# Patient Record
Sex: Female | Born: 1965
Health system: Southern US, Community
[De-identification: ages and names within clinical notes are randomized; demographics above are authoritative.]

## PROBLEM LIST (undated history)

## (undated) DIAGNOSIS — F419 Anxiety disorder, unspecified: Secondary | ICD-10-CM

## (undated) DIAGNOSIS — I10 Essential (primary) hypertension: Secondary | ICD-10-CM

## (undated) DIAGNOSIS — E785 Hyperlipidemia, unspecified: Secondary | ICD-10-CM

## (undated) DIAGNOSIS — F17200 Nicotine dependence, unspecified, uncomplicated: Secondary | ICD-10-CM

## (undated) DIAGNOSIS — S2239XA Fracture of one rib, unspecified side, initial encounter for closed fracture: Secondary | ICD-10-CM

## (undated) DIAGNOSIS — H6690 Otitis media, unspecified, unspecified ear: Secondary | ICD-10-CM

## (undated) DIAGNOSIS — R7611 Nonspecific reaction to tuberculin skin test without active tuberculosis: Secondary | ICD-10-CM

## (undated) DIAGNOSIS — Z86010 Personal history of colonic polyps: Secondary | ICD-10-CM

## (undated) DIAGNOSIS — N6320 Unspecified lump in the left breast, unspecified quadrant: Secondary | ICD-10-CM

## (undated) DIAGNOSIS — N390 Urinary tract infection, site not specified: Secondary | ICD-10-CM

## (undated) DIAGNOSIS — Z8709 Personal history of other diseases of the respiratory system: Secondary | ICD-10-CM

## (undated) HISTORY — DX: Fracture of one rib, unspecified side, initial encounter for closed fracture: S22.39XA

## (undated) HISTORY — DX: Anxiety disorder, unspecified: F41.9

## (undated) HISTORY — DX: Unspecified lump in the left breast, unspecified quadrant: N63.20

## (undated) HISTORY — DX: Nicotine dependence, unspecified, uncomplicated: F17.200

## (undated) HISTORY — DX: Hyperlipidemia, unspecified: E78.5

## (undated) HISTORY — DX: Personal history of colonic polyps: Z86.010

## (undated) HISTORY — DX: Nonspecific reaction to tuberculin skin test without active tuberculosis: R76.11

## (undated) HISTORY — DX: Urinary tract infection, site not specified: N39.0

## (undated) HISTORY — PX: WISDOM TOOTH EXTRACTION: SHX21

## (undated) HISTORY — DX: Essential (primary) hypertension: I10

## (undated) HISTORY — PX: OTHER SURGICAL HISTORY: SHX169

## (undated) HISTORY — PX: TUBAL LIGATION: SHX77

## (undated) HISTORY — DX: Personal history of other diseases of the respiratory system: Z87.09

---

## 1994-12-16 DIAGNOSIS — Z8709 Personal history of other diseases of the respiratory system: Secondary | ICD-10-CM

## 1994-12-16 HISTORY — DX: Personal history of other diseases of the respiratory system: Z87.09

## 2013-11-02 ENCOUNTER — Encounter: Payer: Self-pay | Admitting: Physical Therapy

## 2013-12-16 DIAGNOSIS — N6320 Unspecified lump in the left breast, unspecified quadrant: Secondary | ICD-10-CM

## 2013-12-16 HISTORY — DX: Unspecified lump in the left breast, unspecified quadrant: N63.20

## 2013-12-27 ENCOUNTER — Other Ambulatory Visit (HOSPITAL_COMMUNITY): Payer: Self-pay | Admitting: *Deleted

## 2013-12-27 DIAGNOSIS — N6320 Unspecified lump in the left breast, unspecified quadrant: Secondary | ICD-10-CM

## 2013-12-28 ENCOUNTER — Encounter (HOSPITAL_COMMUNITY): Payer: Self-pay

## 2013-12-28 ENCOUNTER — Encounter (INDEPENDENT_AMBULATORY_CARE_PROVIDER_SITE_OTHER): Payer: Self-pay

## 2013-12-28 ENCOUNTER — Ambulatory Visit (HOSPITAL_COMMUNITY)
Admission: RE | Admit: 2013-12-28 | Discharge: 2013-12-28 | Disposition: A | Discharge status: Home / Self Care | Payer: Self-pay | Source: Ambulatory Visit | Attending: Obstetrics and Gynecology | Admitting: Obstetrics and Gynecology

## 2013-12-28 VITALS — BP 120/78 | Temp 97.7°F | Ht 64.0 in | Wt 128.8 lb

## 2013-12-28 DIAGNOSIS — N6311 Unspecified lump in the right breast, upper outer quadrant: Secondary | ICD-10-CM | POA: Insufficient documentation

## 2013-12-28 DIAGNOSIS — Z01419 Encounter for gynecological examination (general) (routine) without abnormal findings: Secondary | ICD-10-CM

## 2013-12-28 DIAGNOSIS — N6320 Unspecified lump in the left breast, unspecified quadrant: Secondary | ICD-10-CM | POA: Insufficient documentation

## 2013-12-28 HISTORY — DX: Otitis media, unspecified, unspecified ear: H66.90

## 2013-12-28 NOTE — [facility] Notes (Signed)
Complaints of left breast lump x 1 month.  Pap Smear:  Pap smear completed today. Patients last Pap smear was 10 years ago and normal per patient. Per patient has no history of an abnormal Pap smear. No Pap smear results in EPIC.  Physical exam: Breasts Breasts symmetrical. No skin abnormalities bilateral breasts. No nipple retraction bilateral breasts. No nipple discharge bilateral breasts. No lymphadenopathy. Palpated three moveable lumps within the left breast at 2 o'clock 7 cm from the nipple, 6 o'clock under areola, and 7 o'clock 2 cm from the nipple. Palpated a moveable lump within the right breast at 10 o'clock 5 cm from the nipple. Complaints of some tenderness when palpated lumps within the left lower breast. Referred patient to the [facility] for diagnostic mammogram. Appointment scheduled for Tuesday January 04, 2014 at 0900.          Pelvic/Bimanual   Ext Genitalia No lesions, no swelling and no discharge observed on external genitalia.         Vagina Vagina pink and normal texture. No lesions and small amount a vaginal bleeding observed in vagina consistent with patient's menstrual cycle.          Cervix Cervix is present. Cervix pink and of normal texture. Small amount of blood observed at cervical os.     Uterus Uterus is present and palpable. Uterus in normal position and normal size.        Adnexae Bilateral ovaries present and palpable. No tenderness on palpation.          Rectovaginal No rectal exam completed today since patient had no rectal complaints. No skin abnormalities observed on exam.

## 2013-12-28 NOTE — Patient Instructions (Addendum)
Taught Kenyette Gundy how to perform BSE and gave educational materials to take home. Let her know [facility] will cover Pap smears every 3 years unless has a history of abnormal Pap smears. Referred patient to the [facility] for diagnostic mammogram. Appointment scheduled for Tuesday January 04, 2014 at 0900. Patient aware of appointment and will be there. Let patient know will follow up with her within the next couple weeks with results of Pap smear by phone. Smoking cessation discussed with patient. Carlota Philley verbalized understanding.  Yoon Barca, Arvil Chaco, RN 11:41 AM

## 2014-01-04 ENCOUNTER — Ambulatory Visit
Admission: RE | Admit: 2014-01-04 | Discharge: 2014-01-04 | Disposition: A | Discharge status: Home / Self Care | Payer: No Typology Code available for payment source | Source: Ambulatory Visit | Attending: Obstetrics and Gynecology | Admitting: Obstetrics and Gynecology

## 2014-01-04 ENCOUNTER — Other Ambulatory Visit (HOSPITAL_COMMUNITY): Payer: Self-pay | Admitting: Obstetrics and Gynecology

## 2014-01-04 DIAGNOSIS — N6320 Unspecified lump in the left breast, unspecified quadrant: Secondary | ICD-10-CM

## 2014-02-18 ENCOUNTER — Other Ambulatory Visit: Payer: Self-pay | Admitting: Oncology

## 2014-03-16 ENCOUNTER — Telehealth (HOSPITAL_COMMUNITY): Payer: Self-pay | Admitting: *Deleted

## 2014-03-16 NOTE — Telephone Encounter (Signed)
Telephoned patient at home # and discussed negative pap smear results. Next pap smear due in 3 years. Patient voiced understanding.

## 2014-03-29 ENCOUNTER — Other Ambulatory Visit: Payer: Self-pay

## 2014-05-03 ENCOUNTER — Other Ambulatory Visit: Payer: Self-pay | Admitting: Oncology

## 2014-06-23 ENCOUNTER — Other Ambulatory Visit: Payer: Self-pay | Admitting: *Deleted

## 2014-07-15 ENCOUNTER — Other Ambulatory Visit: Payer: Self-pay | Admitting: Obstetrics and Gynecology

## 2014-07-15 DIAGNOSIS — N63 Unspecified lump in unspecified breast: Secondary | ICD-10-CM

## 2014-07-21 ENCOUNTER — Encounter (INDEPENDENT_AMBULATORY_CARE_PROVIDER_SITE_OTHER): Payer: Self-pay

## 2014-07-21 ENCOUNTER — Ambulatory Visit
Admission: RE | Admit: 2014-07-21 | Discharge: 2014-07-21 | Disposition: A | Discharge status: Home / Self Care | Payer: No Typology Code available for payment source | Source: Ambulatory Visit | Attending: Obstetrics and Gynecology | Admitting: Obstetrics and Gynecology

## 2014-07-21 DIAGNOSIS — N63 Unspecified lump in unspecified breast: Secondary | ICD-10-CM

## 2014-08-29 ENCOUNTER — Other Ambulatory Visit: Payer: Self-pay | Admitting: Oncology

## 2014-09-01 ENCOUNTER — Other Ambulatory Visit: Payer: Self-pay | Admitting: Oncology

## 2014-10-17 ENCOUNTER — Encounter (HOSPITAL_COMMUNITY): Payer: Self-pay

## 2014-12-16 DIAGNOSIS — S2239XA Fracture of one rib, unspecified side, initial encounter for closed fracture: Secondary | ICD-10-CM

## 2014-12-16 HISTORY — DX: Fracture of one rib, unspecified side, initial encounter for closed fracture: S22.39XA

## 2014-12-17 ENCOUNTER — Emergency Department (INDEPENDENT_AMBULATORY_CARE_PROVIDER_SITE_OTHER)
Admission: EM | Admit: 2014-12-17 | Discharge: 2014-12-17 | Disposition: A | Discharge status: Home / Self Care | Payer: Self-pay | Source: Home / Self Care | Attending: Emergency Medicine | Admitting: Emergency Medicine

## 2014-12-17 ENCOUNTER — Encounter (HOSPITAL_COMMUNITY): Payer: Self-pay

## 2014-12-17 ENCOUNTER — Ambulatory Visit (HOSPITAL_COMMUNITY): Payer: Self-pay | Attending: Emergency Medicine

## 2014-12-17 DIAGNOSIS — R0781 Pleurodynia: Secondary | ICD-10-CM

## 2014-12-17 DIAGNOSIS — S2231XA Fracture of one rib, right side, initial encounter for closed fracture: Secondary | ICD-10-CM | POA: Insufficient documentation

## 2014-12-17 DIAGNOSIS — W19XXXA Unspecified fall, initial encounter: Secondary | ICD-10-CM | POA: Insufficient documentation

## 2014-12-17 LAB — POCT URINALYSIS DIP (DEVICE)
Bilirubin Urine: NEGATIVE
Glucose, UA: NEGATIVE mg/dL
KETONES UR: NEGATIVE mg/dL
Nitrite: POSITIVE — AB
PROTEIN: NEGATIVE mg/dL
SPECIFIC GRAVITY, URINE: 1.015 (ref 1.005–1.030)
UROBILINOGEN UA: 0.2 mg/dL (ref 0.0–1.0)
pH: 7 (ref 5.0–8.0)

## 2014-12-17 MED ORDER — OXYCODONE-ACETAMINOPHEN 5-325 MG PO TABS: 2.0000 | ORAL_TABLET | ORAL | Status: DC | PRN

## 2014-12-17 NOTE — ED Provider Notes (Signed)
CSN: [id number]     Arrival date & time 12/17/14  1557 History   First MD Initiated Contact with Patient 12/17/14 1637     Chief Complaint  Patient presents with  . Fall   (Consider location/radiation/quality/duration/timing/severity/associated sxs/prior Treatment) HPI  She is a 49 year old woman here for evaluation of right flank pain. She states it started yesterday morning. It is located in her lower ribs on the right side. It is worse with movement and deep breathing. At rest it isn't dull ache, but sharp pains with movement. She denies any dysuria, frequency, suprapubic pain. She did have a fall Thursday night.  Past Medical History  Diagnosis Date  . Ear infection    Past Surgical History  Procedure Laterality Date  . Inner ear surgery    . Tubal ligation     Family History  Problem Relation Age of Onset  . Breast cancer Mother   . Cancer Mother     uterine  . Hypertension Mother   . Stroke Mother   . Cancer Maternal Grandmother     uterine  . Stroke Maternal Grandmother    History  Substance Use Topics  . Smoking status: Current Every Day Smoker -- 1.00 packs/day for 20 years    Types: Cigarettes  . Smokeless tobacco: Never Used  . Alcohol Use: Yes     Comment: socially   OB History    Gravida Para Term Preterm AB TAB SAB Ectopic Multiple Living   4 3 3  1  1   3      Review of Systems  Respiratory: Negative for shortness of breath.   Genitourinary: Positive for flank pain. Negative for dysuria and frequency.    Allergies  Review of patient's allergies indicates no known allergies.  Home Medications   Prior to Admission medications   Medication Sig Start Date End Date Taking? Authorizing Provider  acetaminophen (TYLENOL) 500 MG tablet Take 500 mg by mouth every 6 (six) hours as needed.    Historical Provider, MD  oxyCODONE-acetaminophen (PERCOCET/ROXICET) 5-325 MG per tablet Take 2 tablets by mouth every 4 (four) hours as needed for severe pain. 12/17/14    Melony Overly, MD   BP 132/82 mmHg  Pulse 79  Temp(Src) 98.3 F (36.8 C) (Oral)  Resp 12  SpO2 98%  LMP 12/28/2013 Physical Exam  Constitutional: She is oriented to person, place, and time. She appears well-developed and well-nourished. She appears distressed (uncomfortable looking).  Cardiovascular: Normal rate.   Pulmonary/Chest: Effort normal.  Abdominal: Soft. Bowel sounds are normal. There is no tenderness.  Musculoskeletal:       Back:  Back: no vertebral tenderness.  Tender to light palpation over right CVA.  Neurological: She is alert and oriented to person, place, and time.    ED Course  Procedures (including critical care time) Labs Review Labs Reviewed  POCT URINALYSIS DIP (DEVICE) - Abnormal; Notable for the following:    Hgb urine dipstick TRACE (*)    Nitrite POSITIVE (*)    Leukocytes, UA TRACE (*)    All other components within normal limits  URINE CULTURE    Imaging Review Dg Ribs Unilateral W/chest Right  12/17/2014   CLINICAL DATA:  Status post fall; fell on right side, with posterior lower rib pain. Initial encounter.  EXAM: RIGHT RIBS AND CHEST - 3+ VIEW  COMPARISON:  None.  FINDINGS: There appears to be a minimally displaced fracture of the right lateral eleventh rib. No additional rib fractures are seen.  The lungs are well-aerated and clear. There is no evidence of focal opacification, pleural effusion or pneumothorax. A calcified granuloma is noted at the right midlung zone.  The cardiomediastinal silhouette is within normal limits. No acute osseous abnormalities are seen. An apparent calcified node is seen at the right axilla.  IMPRESSION: 1. Apparent minimally displaced fracture of the right lateral eleventh rib. 2. No acute cardiopulmonary process seen. 3. Prominent calcified node at the right axilla.   Electronically Signed   By: Garald Balding M.D.   On: 12/17/2014 18:05     MDM   1. Rib fracture, right, closed, initial encounter   2. Rib pain     We'll send urine for culture, but hold off on antibiotics as she is asymptomatic.  X-ray is positive for fracture. Discussed that this will heal on its own. Percocet 5-325 milligram tablet, #30 provided to use as needed for pain. Discussed importance of taking deep breaths to prevent pneumonia. Follow-up as needed.    Melony Overly, MD 12/17/14 [phone]

## 2014-12-17 NOTE — Discharge Instructions (Signed)
You have a broken rib. Use the percocet every 4 hours as needed for pain. Make sure you take deep breaths several times a day. After the next week, you should be okay with tylenol and ibuprofen. Follow up as needed.
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
## 2014-12-20 LAB — URINE CULTURE: Colony Count: >=100000

## 2014-12-20 NOTE — ED Notes (Signed)
Urine culture: >100,000 colonies E. Coli.  Message sent to Dr. Bridgett Larsson. Roselyn Meier 12/20/2014

## 2014-12-22 ENCOUNTER — Telehealth (HOSPITAL_COMMUNITY): Payer: Self-pay | Admitting: *Deleted

## 2014-12-22 MED ORDER — CEPHALEXIN 500 MG PO CAPS: 500.0000 mg | ORAL_CAPSULE | Freq: Four times a day (QID) | ORAL | Status: DC

## 2014-12-22 NOTE — ED Notes (Signed)
Dr. Bridgett Larsson e-prescribed Keflex to pt.'s pharmacy.  I called pt. Pt. verified x 2 and given results. Pt. told she needs Keflex for her UTI and where to pick up her Rx.  Pt. Voiced understanding. Roselyn Meier 12/22/2014

## 2015-01-02 ENCOUNTER — Other Ambulatory Visit: Payer: Self-pay | Admitting: Oncology

## 2015-06-21 ENCOUNTER — Encounter: Payer: Self-pay | Admitting: Family Medicine

## 2015-06-21 ENCOUNTER — Ambulatory Visit (INDEPENDENT_AMBULATORY_CARE_PROVIDER_SITE_OTHER): Payer: 59 | Admitting: Family Medicine

## 2015-06-21 ENCOUNTER — Ambulatory Visit (HOSPITAL_BASED_OUTPATIENT_CLINIC_OR_DEPARTMENT_OTHER)
Admission: RE | Admit: 2015-06-21 | Discharge: 2015-06-21 | Disposition: A | Discharge status: Home / Self Care | Payer: 59 | Source: Ambulatory Visit | Attending: Family Medicine | Admitting: Family Medicine

## 2015-06-21 VITALS — BP 122/83 | HR 92 | Temp 98.3°F | Resp 16 | Ht 63.25 in | Wt 133.0 lb

## 2015-06-21 DIAGNOSIS — M7542 Impingement syndrome of left shoulder: Secondary | ICD-10-CM

## 2015-06-21 DIAGNOSIS — M7582 Other shoulder lesions, left shoulder: Secondary | ICD-10-CM

## 2015-06-21 DIAGNOSIS — M19019 Primary osteoarthritis, unspecified shoulder: Secondary | ICD-10-CM

## 2015-06-21 DIAGNOSIS — M25512 Pain in left shoulder: Secondary | ICD-10-CM | POA: Diagnosis not present

## 2015-06-21 DIAGNOSIS — M75102 Unspecified rotator cuff tear or rupture of left shoulder, not specified as traumatic: Secondary | ICD-10-CM | POA: Diagnosis not present

## 2015-06-21 DIAGNOSIS — M259 Joint disorder, unspecified: Secondary | ICD-10-CM

## 2015-06-21 MED ORDER — LIDOCAINE HCL 1 % IJ SOLN
0.5000 mL | Freq: Once | INTRAMUSCULAR | Status: AC
  Administered 2015-06-21: 0.5 mL

## 2015-06-21 MED ORDER — METHYLPREDNISOLONE ACETATE 40 MG/ML IJ SUSP
40.0000 mg | Freq: Once | INTRAMUSCULAR | Status: AC
  Administered 2015-06-21: 40 mg via INTRA_ARTICULAR

## 2015-06-21 MED ORDER — METHYLPREDNISOLONE ACETATE 40 MG/ML IJ SUSP
20.0000 mg | Freq: Once | INTRAMUSCULAR | Status: AC
  Administered 2015-06-21: 20 mg via INTRA_ARTICULAR

## 2015-06-21 MED ORDER — LIDOCAINE HCL 1 % IJ SOLN
2.0000 mL | Freq: Once | INTRAMUSCULAR | Status: AC
  Administered 2015-06-21: 2 mL

## 2015-06-21 NOTE — [facility] Notes (Signed)
Office Note 06/21/2015  CC:  Chief Complaint  Patient presents with  . Establish Care  . Shoulder Pain    left shoulder x 6 weeks   HPI:  Christy [name] is a 49 y.o. White female who is here to establish care and discuss left shoulder pain. Patient's most recent primary MD: doesn't remember--says it has been > 82yrs since having one.  Went to [employer] [facility] mostly. Gets GYN care at the [facility] center at [facility]. Old records in EPIC/HL EMR were reviewed prior to or during today's visit.  About 6-7 wks ago her left shoulder started hurting, anterolateral aspect.  Recalls no injury. She had been moving boxes/furnature prior.  Pain getting worse/more constant, extends down upper arm to elbow level. Worse with aBduction and IR maneuvers.  Some trapezius area pain but no neck pain.  NO tingling or numbness in the arm.  No L arm weakness except that due to pain. Never had this before.  Past Medical History  Diagnosis Date  . Ear infection     Recurrent; lots of scar tissue, surgeries on TM, eventually TM removed.  Marland Kitchen UTI (urinary tract infection)   . Positive skin test for tuberculosis age 67 approx    father had TB and died when she was 67; pt doesn't recall having CXR or getting TB tx.  . Rib fracture 12/2014    s/p fall  . History of pleurisy 1996  . Left breast mass 2015    Appears benign, no bx done, getting serial mammograms via [facility].  . Tobacco dependence     Past Surgical History  Procedure Laterality Date  . Tympanectomy    . Tubal ligation      Family History  Problem Relation Age of Onset  . Breast cancer Mother   . Cancer Mother     uterine  . Hypertension Mother   . Stroke Mother   . Heart disease Mother   . COPD Mother   . Cancer Maternal Grandmother     uterine  . Stroke Maternal Grandmother   . Heart disease Maternal Grandmother   . Hypertension Maternal Grandmother   . Emphysema Father   . Tuberculosis Father     History   Social History  .  Marital Status: Married    Spouse Name: N/A  . Number of Children: N/A  . Years of Education: N/A   Occupational History  . Not on file.   Social History Main Topics  . Smoking status: Current Every Day Smoker -- 1.00 packs/day for 20 years    Types: Cigarettes  . Smokeless tobacco: Never Used  . Alcohol Use: Yes     Comment: socially  . Drug Use: No  . Sexual Activity: Yes    Birth Control/ Protection: Surgical   Other Topics Concern  . Not on file   Social History Narrative   Married, 3 children.   Housewife.   Orig from [place], relocated to [facility] approx 2007.   +Smoker.   MEDS: none  No Known Allergies  ROS Review of Systems  Constitutional: Negative for fever and fatigue.  HENT: Negative for congestion and sore throat.   Eyes: Negative for visual disturbance.  Respiratory: Negative for cough.   Cardiovascular: Negative for chest pain.  Gastrointestinal: Negative for nausea and abdominal pain.  Genitourinary: Negative for dysuria.  Musculoskeletal: Negative for back pain and joint swelling.  Skin: Negative for rash.  Neurological: Negative for weakness and headaches.  Hematological: Negative for adenopathy.  PE; Blood pressure 122/83, pulse 92, temperature 98.3 F (36.8 C), temperature source Oral, resp. rate 16, height 5' 3.25" (1.607 m), weight 133 lb (60.328 kg), last menstrual period 12/28/2013, SpO2 97 %. Gen: Alert, well appearing.  Patient is oriented to person, place, time, and situation. CV: RRR, no m/r/g.   LUNGS: CTA bilat, nonlabored resps, good aeration in all lung fields. Neck nontender, ROM intact. L AC joint TTP, mild TTP around acromion hook as well as over top of L trapezius mm. Tender over biceps head (long).  +Impingement signs on L shoulder. Neg drop sign.  She can only aBduct her left shoulder to 70 deg due to pain.  Resisted IR and ER don't elicit pain, but she has pain with trying to reach around and touch her ipsilateral scapula with  L hand.  Prox and distal strength intact, sensation normal.  Pertinent labs:  none  ASSESSMENT AND PLAN:   New pt; no old records to obtain.  Left shoulder pain, acute but prolonged. Not responding to NSAIDs/tylenol. Plan: x-ray shoulder. Steroid injection in L AC joint and L subacromial space. Referral to [facility] PT.   Procedure: Therapeutic left shoulder injection: AC joint AND subacromial space.  The patient's clinical condition is marked by substantial pain and/or significant functional disability.  Other conservative therapy has not provided relief, is contraindicated, or not appropriate.  There is a reasonable likelihood that injection will significantly improve the patient's pain and/or functional disability. Cleaned skin with alcohol swab, used posterolateral approach, Injected 1 ml of 40mg /ml depo medrol + 2 ml 1% plain lidocaine into subacromial space without resistance.  No immediate complications.  Patient tolerated procedure well.  I then injected 1/2 ml of 40mg /ml depo medrol + 1/2 ml of 1% plain lidocaine into AC joint using superior approach with 5/8 inch, 25 gauge needle.  Post-injection care discussed, including 20 min of icing 1-2 times in the next 4-8 hours, frequent non weight-bearing ROM exercises over the next few days, and general pain medication management.  An After Visit Summary was printed and given to the patient.  Return in about 3 weeks (around 07/12/2015) for f/u shoulder pain.

## 2015-06-21 NOTE — [facility] Notes (Signed)
Pre visit review using our clinic review tool, if applicable. No additional management support is needed unless otherwise documented below in the visit note. 

## 2015-06-21 NOTE — Patient Instructions (Signed)
Ice your shoulder tonight for 20-30 min TWICE.

## 2015-06-22 ENCOUNTER — Telehealth: Payer: Self-pay | Admitting: *Deleted

## 2015-06-22 ENCOUNTER — Other Ambulatory Visit: Payer: Self-pay | Admitting: Family Medicine

## 2015-06-22 MED ORDER — OXYCODONE-ACETAMINOPHEN 5-325 MG PO TABS: 1.0000 | ORAL_TABLET | Freq: Four times a day (QID) | ORAL | Status: DC | PRN

## 2015-06-22 NOTE — Telephone Encounter (Signed)
OK, percocet rx printed. Reassure pt that worse pain today can be normal. If still worse tomorrow despite use of this pain med then tell her to call and let us know.-thx

## 2015-06-22 NOTE — Telephone Encounter (Signed)
Pt LMOM stating that she is in more pain today then she was in yesterday and she has less mobility. She wants to know if this is normal. Also wanted to know if apt for PT has been set up yet. Spoke to [place] and she is going to check into this and let me know.

## 2015-06-22 NOTE — Telephone Encounter (Signed)
Pt advised and voiced understanding.   

## 2015-06-29 ENCOUNTER — Other Ambulatory Visit: Payer: Self-pay | Admitting: Oncology

## 2015-07-12 ENCOUNTER — Ambulatory Visit (INDEPENDENT_AMBULATORY_CARE_PROVIDER_SITE_OTHER): Payer: 59 | Admitting: Family Medicine

## 2015-07-12 ENCOUNTER — Encounter: Payer: Self-pay | Admitting: Family Medicine

## 2015-07-12 VITALS — BP 108/76 | HR 88 | Temp 98.0°F | Resp 16 | Ht 63.25 in | Wt 132.0 lb

## 2015-07-12 DIAGNOSIS — N390 Urinary tract infection, site not specified: Secondary | ICD-10-CM

## 2015-07-12 DIAGNOSIS — R319 Hematuria, unspecified: Secondary | ICD-10-CM | POA: Diagnosis not present

## 2015-07-12 DIAGNOSIS — M25512 Pain in left shoulder: Secondary | ICD-10-CM | POA: Diagnosis not present

## 2015-07-12 DIAGNOSIS — M7582 Other shoulder lesions, left shoulder: Secondary | ICD-10-CM

## 2015-07-12 LAB — POCT URINALYSIS DIPSTICK
GLUCOSE UA: NEGATIVE
NITRITE UA: NEGATIVE
Protein, UA: NEGATIVE
Spec Grav, UA: >=1.03
Urobilinogen, UA: 0.2
pH, UA: 5.5

## 2015-07-12 MED ORDER — SULFAMETHOXAZOLE-TRIMETHOPRIM 800-160 MG PO TABS: 1.0000 | ORAL_TABLET | Freq: Two times a day (BID) | ORAL | Status: DC

## 2015-07-12 MED ORDER — OXYCODONE-ACETAMINOPHEN 5-325 MG PO TABS: 1.0000 | ORAL_TABLET | Freq: Four times a day (QID) | ORAL | Status: DC | PRN

## 2015-07-12 NOTE — [facility] Notes (Signed)
OFFICE VISIT  07/13/2015   CC:  Chief Complaint  Patient presents with  . Follow-up    3 week f/u left shoulder pain  . Urinary Tract Infection    x 2 days   HPI:    Patient is a 49 y.o. Caucasian female who presents for 3 wk f/u L shoulder pain.  I did steroid injection into L AC joint and L subacromial space at last visit.  I referred her to [facility] PT.  A few days after the injection she called and I sent in some pain meds.   Says pain more steady and intense that when she first came, hurts to ABduct and IR/ER. Home PT not helping any.  No arm tingling or numbness.  No neck pain.  She complains today of urinary symptoms: slight burning with urination, musty odor, suprapubic pressure noted. No nausea and no flank pain or back pain.  Color is darker yellow.  She is trying to hydrate well.  Past Medical History  Diagnosis Date  . Ear infection     Recurrent; lots of scar tissue, surgeries on TM, eventually TM removed.  Marland Kitchen UTI (urinary tract infection)   . Positive skin test for tuberculosis age 45 approx    father had TB and died when she was 63; pt doesn't recall having CXR or getting TB tx.  . Rib fracture 12/2014    s/p fall  . History of pleurisy 1996  . Left breast mass 2015    Appears benign, no bx done, getting serial mammograms via [facility].  . Tobacco dependence     Past Surgical History  Procedure Laterality Date  . Tympanectomy    . Tubal ligation      Outpatient Prescriptions Prior to Visit  Medication Sig Dispense Refill  . acetaminophen (TYLENOL) 500 MG tablet Take 500 mg by mouth every 6 (six) hours as needed.    Marland Kitchen oxyCODONE-acetaminophen (PERCOCET/ROXICET) 5-325 MG per tablet Take 1-2 tablets by mouth every 6 (six) hours as needed for severe pain. (Patient not taking: Reported on 07/12/2015) 30 tablet 0   No facility-administered medications prior to visit.    No Known Allergies  ROS As per HPI  PE: Blood pressure 108/76, pulse 88,  temperature 98 F (36.7 C), temperature source Oral, resp. rate 16, height 5' 3.25" (1.607 m), weight 132 lb (59.875 kg), last menstrual period 12/28/2013, SpO2 95 %. Gen: Alert, well appearing.  Patient is oriented to person, place, time, and situation. L shoulder aBduction limited to 40 deg due to pain, ER and IR painful. TTP around acromion and directly over lateral deltoid mm. + O'brien's test.  No TTP over [employer] joint or clavicle. No CVA TTP.  LABS:  CC UA today showed small blood and leukocytes  IMPRESSION AND PLAN:  1) L shoulder pain, suspect rotator cuff tendonitis vs tear, also possible labral injury. Not improving with approx 3 wks of PT, no improvement s/p steroid injection 3 wks ago. Will arrange for L shoulder MRI w/out contrast.  May ultimately need ortho surg consult. I did renew her percocet rx ofr #30 more today.  2) UTI: send urine for c/s. Start bactrim DS 1 tab bid x 3d.  An After Visit Summary was printed and given to the patient.  FOLLOW UP: Return for f/u to be determined based on results of MRI shouder.

## 2015-07-12 NOTE — [facility] Notes (Signed)
Pre visit review using our clinic review tool, if applicable. No additional management support is needed unless otherwise documented below in the visit note. 

## 2015-07-15 ENCOUNTER — Ambulatory Visit
Admission: RE | Admit: 2015-07-15 | Discharge: 2015-07-15 | Disposition: A | Discharge status: Home / Self Care | Payer: 59 | Source: Ambulatory Visit | Attending: Family Medicine | Admitting: Family Medicine

## 2015-07-15 DIAGNOSIS — M25512 Pain in left shoulder: Secondary | ICD-10-CM

## 2015-07-15 DIAGNOSIS — M7582 Other shoulder lesions, left shoulder: Secondary | ICD-10-CM

## 2015-07-15 LAB — URINE CULTURE

## 2015-07-18 ENCOUNTER — Other Ambulatory Visit: Payer: Self-pay | Admitting: *Deleted

## 2015-07-18 DIAGNOSIS — M7552 Bursitis of left shoulder: Secondary | ICD-10-CM

## 2015-07-18 DIAGNOSIS — M7582 Other shoulder lesions, left shoulder: Secondary | ICD-10-CM

## 2015-07-18 DIAGNOSIS — M7502 Adhesive capsulitis of left shoulder: Secondary | ICD-10-CM

## 2015-12-22 ENCOUNTER — Ambulatory Visit (INDEPENDENT_AMBULATORY_CARE_PROVIDER_SITE_OTHER): Payer: Commercial Managed Care - HMO | Admitting: Family Medicine

## 2015-12-22 ENCOUNTER — Encounter: Payer: Self-pay | Admitting: Family Medicine

## 2015-12-22 VITALS — BP 121/82 | HR 69 | Temp 97.8°F | Resp 16 | Ht 63.25 in | Wt 138.0 lb

## 2015-12-22 DIAGNOSIS — F411 Generalized anxiety disorder: Secondary | ICD-10-CM

## 2015-12-22 DIAGNOSIS — Z780 Asymptomatic menopausal state: Secondary | ICD-10-CM

## 2015-12-22 DIAGNOSIS — N3000 Acute cystitis without hematuria: Secondary | ICD-10-CM

## 2015-12-22 DIAGNOSIS — R300 Dysuria: Secondary | ICD-10-CM

## 2015-12-22 DIAGNOSIS — N951 Menopausal and female climacteric states: Secondary | ICD-10-CM

## 2015-12-22 LAB — POC URINALSYSI DIPSTICK (AUTOMATED)
Bilirubin, UA: NEGATIVE
Glucose, UA: NEGATIVE
KETONES UA: NEGATIVE
Nitrite, UA: POSITIVE
PH UA: 6
Protein, UA: NEGATIVE
Spec Grav, UA: 1.02
Urobilinogen, UA: 0.2

## 2015-12-22 MED ORDER — SULFAMETHOXAZOLE-TRIMETHOPRIM 800-160 MG PO TABS
1.0000 | ORAL_TABLET | Freq: Two times a day (BID) | ORAL | Status: DC
Start: 2015-12-22 — End: 2016-01-19

## 2015-12-22 MED ORDER — FLUOXETINE HCL (PMDD) 10 MG PO TABS: ORAL_TABLET | ORAL | Status: DC

## 2015-12-22 NOTE — [facility] Notes (Signed)
OFFICE VISIT  12/22/2015   CC:  Chief Complaint  Patient presents with  . Urinary Tract Infection    x 4 days  . Menopause    Mood Swings   HPI:    Patient is a 50 y.o. Caucasian female who presents for dysuria every day the last 5d, mostly mornings and she relates this to having intercourse with husband the night prior.  No frequency or urgency or incomplete emptying.  "Going a lot" is not new and she drinks lots of fluids.  No nausea.  No upper back pain or flank pain or abd pain but has L low back pain lately.  No fever.  Took azo but none in the last 2 days. Last UTI 06/2015 had similar sx's and grew e coli--sensitive to everything except ampicillin.  She got better with bactrim.  Says she has menopausal mood swings.  Says husband complains that she will snap at him in anger about something, and she finds herself getting more easily frustrated with things lately.  More anxiety lately w/out any clear triggers, "sense of impending doom" yesterday.  She has not had a menstrual period in over 2 yrs, has hot flashes only occasionally now but these were bad at first.  Her sleep is poor but she is tired.  Denies depressed mood. Was on xanax in the past after her husband died but only short period.  No other meds. She is interested in a med to help her "chill out" a little bit.    Past Medical History  Diagnosis Date  . Ear infection     Recurrent; lots of scar tissue, surgeries on TM, eventually TM removed.  Marland Kitchen UTI (urinary tract infection)   . Positive skin test for tuberculosis age 64 approx    father had TB and died when she was 75; pt doesn't recall having CXR or getting TB tx.  . Rib fracture 12/2014    s/p fall  . History of pleurisy 1996  . Left breast mass 2015    Appears benign, no bx done, getting serial mammograms via [facility].  . Tobacco dependence     Past Surgical History  Procedure Laterality Date  . Tympanectomy    . Tubal ligation     MEDS: tylenol  prn  No Known Allergies  ROS As per HPI  PE: Blood pressure 121/82, pulse 69, temperature 97.8 F (36.6 C), temperature source Oral, resp. rate 16, height 5' 3.25" (1.607 m), weight 138 lb (62.596 kg), last menstrual period 12/28/2013, SpO2 92 %. Gen: Alert, well appearing.  Patient is oriented to person, place, time, and situation. CV: RRR, no m/r/g.   LUNGS: CTA bilat, nonlabored resps, good aeration in all lung fields. BACK: minimal TTP in lower thoracic paraspinous soft tissues, but no CVA tenderness.  LABS:  CC UA today showed small blood, nitrite pos, small LEU, otherwise normal  IMPRESSION AND PLAN:  1) UTI; send urine for c/s. ? UA affected by AZO? Started bactrim DS 1 bid x 5d, may stop abx if all sx's resolved after 3d.  2) Postmenopausal syndrome/anxiety/irritability: decided to try small dose of fluoxetine: 10mg  qd started. Therapeutic expectations and side effect profile of medication discussed today.  Patient's questions answered.  An After Visit Summary was printed and given to the patient.  FOLLOW UP: Return in about 4 weeks (around 01/19/2016) for f/u anxiety/med.

## 2015-12-22 NOTE — [facility] Notes (Signed)
Pre visit review using our clinic review tool, if applicable. No additional management support is needed unless otherwise documented below in the visit note. 

## 2015-12-25 LAB — URINE CULTURE: Colony Count: >=100000

## 2016-01-19 ENCOUNTER — Encounter: Payer: Self-pay | Admitting: Family Medicine

## 2016-01-19 ENCOUNTER — Ambulatory Visit (INDEPENDENT_AMBULATORY_CARE_PROVIDER_SITE_OTHER): Payer: Commercial Managed Care - HMO | Admitting: Family Medicine

## 2016-01-19 ENCOUNTER — Encounter: Payer: Self-pay | Admitting: Gastroenterology

## 2016-01-19 VITALS — BP 115/84 | HR 83 | Temp 97.9°F | Resp 16 | Ht 63.25 in | Wt 140.0 lb

## 2016-01-19 DIAGNOSIS — N951 Menopausal and female climacteric states: Secondary | ICD-10-CM | POA: Diagnosis not present

## 2016-01-19 DIAGNOSIS — Z23 Encounter for immunization: Secondary | ICD-10-CM

## 2016-01-19 DIAGNOSIS — F410 Panic disorder [episodic paroxysmal anxiety] without agoraphobia: Secondary | ICD-10-CM

## 2016-01-19 DIAGNOSIS — F411 Generalized anxiety disorder: Secondary | ICD-10-CM | POA: Diagnosis not present

## 2016-01-19 DIAGNOSIS — Z1211 Encounter for screening for malignant neoplasm of colon: Secondary | ICD-10-CM | POA: Diagnosis not present

## 2016-01-19 MED ORDER — FLUOXETINE HCL 20 MG PO TABS: 20.0000 mg | ORAL_TABLET | Freq: Every day | ORAL | Status: DC

## 2016-01-19 NOTE — Addendum Note (Signed)
Addended by: Onalee Hua on: 01/19/2016 11:38 AM   Modules accepted: Orders

## 2016-01-19 NOTE — [facility] Notes (Signed)
Pre visit review using our clinic review tool, if applicable. No additional management support is needed unless otherwise documented below in the visit note. 
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
## 2016-03-01 ENCOUNTER — Encounter: Payer: Self-pay | Admitting: Family Medicine

## 2016-03-01 ENCOUNTER — Ambulatory Visit (INDEPENDENT_AMBULATORY_CARE_PROVIDER_SITE_OTHER): Payer: Commercial Managed Care - HMO | Admitting: Family Medicine

## 2016-03-01 VITALS — BP 127/77 | HR 79 | Temp 97.5°F | Resp 16 | Ht 63.25 in | Wt 140.0 lb

## 2016-03-01 DIAGNOSIS — F411 Generalized anxiety disorder: Secondary | ICD-10-CM

## 2016-03-01 DIAGNOSIS — H6091 Unspecified otitis externa, right ear: Secondary | ICD-10-CM

## 2016-03-01 MED ORDER — CLOTRIMAZOLE 1 % EX SOLN: CUTANEOUS | Status: DC

## 2016-03-01 MED ORDER — FLUOXETINE HCL 20 MG PO TABS: 20.0000 mg | ORAL_TABLET | Freq: Every day | ORAL | Status: DC

## 2016-03-01 NOTE — [facility] Notes (Signed)
Pre visit review using our clinic review tool, if applicable. No additional management support is needed unless otherwise documented below in the visit note. 

## 2016-03-01 NOTE — Progress Notes (Signed)
OFFICE VISIT  03/01/2016   CC:  Chief Complaint  Patient presents with  . Follow-up    Anxiety  . Ear Drainage    right x 1 week    HPI:    Patient is a 50 y.o. Caucasian female who presents for 6 wk f/u GAD/panic. Increased fluoxetine to 20mg  qd last visit. Doing much better, just a bit of hand tremor noted as possible side effect. Mood improved, anxiety and irritability is down.  Sleep initiation still a chronic problem---nothing new.   Appetite ok.    Right ear drainage for about a week, starting to look brown, no pain.  This is in the ear with hx of surgery on TM.  No changes in hearing lately.  Past Medical History  Diagnosis Date  . Ear infection     Recurrent; lots of scar tissue, surgeries on TM, eventually TM removed.  Marland Kitchen UTI (urinary tract infection)   . Positive skin test for tuberculosis age 50 approx    father had TB and died when she was 30; pt doesn't recall having CXR or getting TB tx.  . Rib fracture 12/2014    s/p fall  . History of pleurisy 1996  . Left breast mass 2015    Appears benign, no bx done, getting serial mammograms via [facility].  . Tobacco dependence     Past Surgical History  Procedure Laterality Date  . Tympanectomy    . Tubal ligation      Outpatient Prescriptions Prior to Visit  Medication Sig Dispense Refill  . acetaminophen (TYLENOL) 500 MG tablet Take 500 mg by mouth every 6 (six) hours as needed.    Marland Kitchen FLUoxetine (PROZAC) 20 MG tablet Take 1 tablet (20 mg total) by mouth daily. 30 tablet 1   No facility-administered medications prior to visit.    No Known Allergies  ROS As per HPI  PE: Blood pressure 127/77, pulse 79, temperature 97.5 F (36.4 C), temperature source Oral, resp. rate 16, height 5' 3.25" (1.607 m), weight 140 lb (63.504 kg), last menstrual period 12/28/2013, SpO2 95 %. Wt Readings from Last 2 Encounters:  03/01/16 140 lb (63.504 kg)  01/19/16 140 lb (63.504 kg)    Gen: alert, oriented x 4,  affect pleasant.  Lucid thinking and conversation noted. HEENT: PERRLA, EOMI.   Right ear canal with erythema and greenish film on the wall of canal distally.  No swelling, no active drainage, no tenderness. Neck: no LAD, mass, or thyromegaly. CV: RRR, no m/r/g LUNGS: CTA bilat, nonlabored. NEURO: no tremor or tics noted on observation.  Coordination intact. CN 2-12 grossly intact bilaterally, strength 5/5 in all extremeties.  No ataxia.   LABS:  None today  IMPRESSION AND PLAN:  1) GAD: improved on 20mg  fluoxetine.  Continue this, rf sent in today.  2) R External otitis, suspect fungal type. Clotrimazole external solution 1%, 3 drops in right ear bid x 14d.  Repeat 14d treatment if still draining in 2 wks.  Return before that time if worsening.  An After Visit Summary was printed and given to the patient.  FOLLOW UP: Return in about 6 months (around 09/01/2016) for annual CPE (fasting).

## 2016-03-04 ENCOUNTER — Ambulatory Visit (AMBULATORY_SURGERY_CENTER): Payer: Self-pay | Admitting: *Deleted

## 2016-03-04 VITALS — Ht 64.0 in | Wt 140.0 lb

## 2016-03-04 DIAGNOSIS — Z1211 Encounter for screening for malignant neoplasm of colon: Secondary | ICD-10-CM

## 2016-03-04 MED ORDER — NA SULFATE-K SULFATE-MG SULF 17.5-3.13-1.6 GM/177ML PO SOLN: 1.0000 | Freq: Once | ORAL | Status: DC

## 2016-03-04 NOTE — Progress Notes (Signed)
No egg or soy allergy known to patient  No issues with past sedation with any surgeries  or procedures, no intubation problems  No diet pills per patient No home 02 use per patient  No blood thinners per patient  Pt denies issues with constipation   emmi video to e mail    [email or web address]

## 2016-03-16 DIAGNOSIS — Z860101 Personal history of adenomatous and serrated colon polyps: Secondary | ICD-10-CM

## 2016-03-16 DIAGNOSIS — Z86010 Personal history of colonic polyps: Secondary | ICD-10-CM

## 2016-03-16 HISTORY — DX: Personal history of adenomatous and serrated colon polyps: Z86.0101

## 2016-03-16 HISTORY — DX: Personal history of colonic polyps: Z86.010

## 2016-03-18 ENCOUNTER — Encounter: Payer: Self-pay | Admitting: Gastroenterology

## 2016-03-18 ENCOUNTER — Ambulatory Visit (AMBULATORY_SURGERY_CENTER): Payer: Commercial Managed Care - HMO | Admitting: Gastroenterology

## 2016-03-18 VITALS — BP 118/86 | HR 70 | Temp 97.3°F | Resp 12 | Ht 64.0 in | Wt 140.0 lb

## 2016-03-18 DIAGNOSIS — D128 Benign neoplasm of rectum: Secondary | ICD-10-CM | POA: Diagnosis not present

## 2016-03-18 DIAGNOSIS — D122 Benign neoplasm of ascending colon: Secondary | ICD-10-CM

## 2016-03-18 DIAGNOSIS — Z1211 Encounter for screening for malignant neoplasm of colon: Secondary | ICD-10-CM | POA: Diagnosis present

## 2016-03-18 HISTORY — PX: COLONOSCOPY: SHX174

## 2016-03-18 MED ORDER — SODIUM CHLORIDE 0.9 % IV SOLN: 500.0000 mL | INTRAVENOUS | Status: DC

## 2016-03-18 NOTE — Progress Notes (Signed)
Called to room to assist during endoscopic procedure.  Patient ID and intended procedure confirmed with present staff. Received instructions for my participation in the procedure from the performing physician.  

## 2016-03-18 NOTE — Progress Notes (Signed)
Report to PACU, RN, vss, BBS= Clear.  

## 2016-03-18 NOTE — Patient Instructions (Signed)
Impressions/recommendations:  Polyps (handout given)  No aspirin or NSAIDS for 2 weeks. May resume 4/18 if needed.  Repeat colonoscopy pending pathology results.  YOU HAD AN ENDOSCOPIC PROCEDURE TODAY AT [facility]:   Refer to the procedure report that was given to you for any specific questions about what was found during the examination.  If the procedure report does not answer your questions, please call your gastroenterologist to clarify.  If you requested that your care partner not be given the details of your procedure findings, then the procedure report has been included in a sealed envelope for you to review at your convenience later.  YOU SHOULD EXPECT: Some feelings of bloating in the abdomen. Passage of more gas than usual.  Walking can help get rid of the air that was put into your GI tract during the procedure and reduce the bloating. If you had a lower endoscopy (such as a colonoscopy or flexible sigmoidoscopy) you may notice spotting of blood in your stool or on the toilet paper. If you underwent a bowel prep for your procedure, you may not have a normal bowel movement for a few days.  Please Note:  You might notice some irritation and congestion in your nose or some drainage.  This is from the oxygen used during your procedure.  There is no need for concern and it should clear up in a day or so.  SYMPTOMS TO REPORT IMMEDIATELY:   Following lower endoscopy (colonoscopy or flexible sigmoidoscopy):  Excessive amounts of blood in the stool  Significant tenderness or worsening of abdominal pains  Swelling of the abdomen that is new, acute  Fever of 100F or higher   For urgent or emergent issues, a gastroenterologist can be reached at any hour by calling [phone].   DIET: Your first meal following the procedure should be a small meal and then it is ok to progress to your normal diet. Heavy or fried foods are harder to digest and may make you feel nauseous  or bloated.  Likewise, meals heavy in dairy and vegetables can increase bloating.  Drink plenty of fluids but you should avoid alcoholic beverages for 24 hours.  ACTIVITY:  You should plan to take it easy for the rest of today and you should NOT DRIVE or use heavy machinery until tomorrow (because of the sedation medicines used during the test).    FOLLOW UP: Our staff will call the number listed on your records the next business day following your procedure to check on you and address any questions or concerns that you may have regarding the information given to you following your procedure. If we do not reach you, we will leave a message.  However, if you are feeling well and you are not experiencing any problems, there is no need to return our call.  We will assume that you have returned to your regular daily activities without incident.  If any biopsies were taken you will be contacted by phone or by letter within the next 1-3 weeks.  Please call us at [phone] if you have not heard about the biopsies in 3 weeks.    SIGNATURES/CONFIDENTIALITY: You and/or your care partner have signed paperwork which will be entered into your electronic medical record.  These signatures attest to the fact that that the information above on your After Visit Summary has been reviewed and is understood.  Full responsibility of the confidentiality of this discharge information lies with you and/or your care-partner.

## 2016-03-18 NOTE — Op Note (Signed)
[place] Patient Name: Christy [name] Procedure Date: 03/18/2016 9:11 AM MRN: [id number] Endoscopist: Christy [name] , Christy [name] Age: 50 Referring Christy [name]:  Date of Birth: [birth year] Gender: Female Procedure:                Colonoscopy Indications:              Screening for colorectal malignant neoplasm Medicines:                Monitored Anesthesia Care Procedure:                Pre-Anesthesia Assessment:                           - Prior to the procedure, a History and Physical                            was performed, and patient medications and                            allergies were reviewed. The patient's tolerance of                            previous anesthesia was also reviewed. The risks                            and benefits of the procedure and the sedation                            options and risks were discussed with the patient.                            All questions were answered, and informed consent                            was obtained. Prior Anticoagulants: The patient has                            taken no previous anticoagulant or antiplatelet                            agents. ASA Grade Assessment: II - A patient with                            mild systemic disease. After reviewing the risks                            and benefits, the patient was deemed in                            satisfactory condition to undergo the procedure.                           After obtaining informed consent, the colonoscope  was passed under direct vision. Throughout the                            procedure, the patient's blood pressure, pulse, and                            oxygen saturations were monitored continuously. The                            Model PCF-H190L [id number]) scope was introduced                            through the anus and advanced to the the cecum,                            identified by appendiceal orifice  and ileocecal                            valve. The colonoscopy was performed without                            difficulty. The patient tolerated the procedure                            well. The quality of the bowel preparation was                            good. The ileocecal valve, appendiceal orifice, and                            rectum were photographed. Scope In: 9:13:35 AM Scope Out: 9:29:12 AM Scope Withdrawal Time: 0 hours 11 minutes 24 seconds  Total Procedure Duration: 0 hours 15 minutes 37 seconds  Findings:      The perianal and digital rectal examinations were normal.      A 4 mm polyp was found in the ascending colon. The polyp was sessile.       The polyp was removed with a cold snare. Resection and retrieval were       complete.      A 4 mm polyp was found in the rectum. The polyp was sessile. The polyp       was removed with a cold snare. Resection and retrieval were complete.      The exam was otherwise without abnormality. Complications:            No immediate complications. Estimated blood loss:                            Minimal. Estimated Blood Loss:     Estimated blood loss was minimal. Impression:               - One 4 mm polyp in the ascending colon, removed                            with a cold snare. Resected and retrieved.                           -  One 4 mm polyp in the rectum, removed with a cold                            snare. Resected and retrieved.                           - The examination was otherwise normal. Recommendation:           - Patient has a contact number available for                            emergencies. The signs and symptoms of potential                            delayed complications were discussed with the                            patient. Return to normal activities tomorrow.                            Written discharge instructions were provided to the                            patient.                           -  Resume previous diet.                           - Continue present medications.                           - No aspirin, ibuprofen, naproxen, or other                            non-steroidal anti-inflammatory drugs for 2 weeks                            after polyp removal.                           - Await pathology results.                           - Repeat colonoscopy is recommended for                            surveillance. The colonoscopy date will be                            determined after pathology results from today's                            exam become available for review. Procedure Code(s):        --- Professional ---  45385, Colonoscopy, flexible; with removal of                            tumor(s), polyp(s), or other lesion(s) by snare                            technique CPT copyright 2016 American Medical Association. All rights reserved. Christy Lipps P. Armbruster, Christy [name] 03/18/2016 9:32:37 AM This report has been signed electronically. Number of Addenda: 0 Referring Christy [name]:      Tammi Sou

## 2016-03-19 ENCOUNTER — Telehealth: Payer: Self-pay

## 2016-03-19 NOTE — Telephone Encounter (Signed)
  Follow up Call-  Call back number 03/18/2016  Post procedure Call Back phone  # [phone]  Permission to leave phone message Yes    Patient was called for follow up after her procedure on 03/18/2016. No answer at the number given for follow up phone call. A message was left on her answering machine.

## 2016-03-22 ENCOUNTER — Encounter: Payer: Self-pay | Admitting: Gastroenterology

## 2016-04-11 ENCOUNTER — Other Ambulatory Visit: Payer: Self-pay | Admitting: *Deleted

## 2016-04-11 MED ORDER — FLUOXETINE HCL 20 MG PO TABS: 20.0000 mg | ORAL_TABLET | Freq: Every day | ORAL | Status: DC

## 2016-04-11 NOTE — Telephone Encounter (Signed)
RF request for fluoxetine LOV: 03/01/16 Next ov: 09/02/16 Last written: 03/01/16 #90 w/ 3RF

## 2016-04-16 ENCOUNTER — Other Ambulatory Visit: Payer: Self-pay | Admitting: *Deleted

## 2016-04-16 MED ORDER — FLUOXETINE HCL 20 MG PO TABS: 20.0000 mg | ORAL_TABLET | Freq: Every day | ORAL | Status: DC

## 2016-04-16 NOTE — Telephone Encounter (Signed)
Refill request from [facility] requesting 90 day supply  RF request for fluoxetine LOV: 03/01/16 Next ov: 09/02/16 Last written: 04/11/16 #90 w/ 3RF - this Rx sent to local pharmacy

## 2016-09-02 ENCOUNTER — Ambulatory Visit (INDEPENDENT_AMBULATORY_CARE_PROVIDER_SITE_OTHER): Payer: Commercial Managed Care - HMO | Admitting: Family Medicine

## 2016-09-02 ENCOUNTER — Encounter: Payer: Self-pay | Admitting: Family Medicine

## 2016-09-02 VITALS — BP 124/79 | HR 73 | Temp 98.0°F | Resp 16 | Ht 64.5 in | Wt 139.4 lb

## 2016-09-02 DIAGNOSIS — Z0000 Encounter for general adult medical examination without abnormal findings: Secondary | ICD-10-CM

## 2016-09-02 DIAGNOSIS — R1030 Lower abdominal pain, unspecified: Secondary | ICD-10-CM | POA: Diagnosis not present

## 2016-09-02 DIAGNOSIS — Z0001 Encounter for general adult medical examination with abnormal findings: Secondary | ICD-10-CM | POA: Diagnosis not present

## 2016-09-02 NOTE — [facility] Notes (Signed)
Pre visit review using our clinic review tool, if applicable. No additional management support is needed unless otherwise documented below in the visit note. 

## 2016-09-02 NOTE — Progress Notes (Signed)
Office Note 09/02/2016  CC:  Chief Complaint  Patient presents with  . Annual Exam   HPI:  Christy [name] is a 50 y.o. White female who is here for annual health maintenance exam.  Plans on next GYN f/u for pap/pelvic 12/2016.  She'll get mammogram at that point as well.  Complains of some intermittent RLQ pain, mild/mod intensity, lasts 1-2 days ---has occurred on 2 occasions about 1 mo apart.  No n/v, no fever.  No diarrhea or constipation.  No vag bleeding, no vag d/c, no urinary complaints.    Past Medical History:  Diagnosis Date  . Anxiety   . Ear infection    Recurrent; lots of scar tissue, surgeries on TM, eventually TM removed.  Marland Kitchen History of adenomatous polyp of colon 03/2016   Recall 5 yrs (Dr. Havery Moros)  . History of pleurisy 1996  . Left breast mass 2015   Appears benign, no bx done, getting serial mammograms via [facility].  Marland Kitchen Positive skin test for tuberculosis age 2 approx   father had TB and died when she was 70; pt doesn't recall having CXR or getting TB tx.  . Rib fracture 12/2014   s/p fall  . Tobacco dependence   . UTI (urinary tract infection)     Past Surgical History:  Procedure Laterality Date  . COLONOSCOPY  03/18/2016   Tubular adenoma x 1.  Recall 5 yrs  . TUBAL LIGATION    . tympanectomy    . WISDOM TOOTH EXTRACTION      Family History  Problem Relation Age of Onset  . Breast cancer Mother   . Cancer Mother     uterine  . Hypertension Mother   . Stroke Mother   . Heart disease Mother   . COPD Mother   . Cancer Maternal Grandmother     uterine  . Stroke Maternal Grandmother   . Heart disease Maternal Grandmother   . Hypertension Maternal Grandmother   . Emphysema Father   . Tuberculosis Father   . Colon polyps Neg Hx   . Colon cancer Neg Hx   . Esophageal cancer Neg Hx   . Rectal cancer Neg Hx   . Stomach cancer Neg Hx     Social History   Social History  . Marital status: Married    Spouse name: N/A  .  Number of children: N/A  . Years of education: N/A   Occupational History  . Not on file.   Social History Main Topics  . Smoking status: Current Every Day Smoker    Packs/day: 1.00    Years: 20.00    Types: E-cigarettes  . Smokeless tobacco: Never Used     Comment: e-vapor cigarettes -3 mg nicotine and does 3 0z a day   . Alcohol use 0.0 oz/week     Comment: socially  . Drug use: No  . Sexual activity: Yes    Birth control/ protection: Surgical   Other Topics Concern  . Not on file   Social History Narrative   Married, 3 children.   Housewife.   Orig from [place], relocated to [address] [facility] [address] [facility] approx 2007.   +Smoker.    Outpatient Medications Prior to Visit  Medication Sig Dispense Refill  . acetaminophen (TYLENOL) 500 MG tablet Take 500 mg by mouth every 6 (six) hours as needed.    . clotrimazole (LOTRIMIN) 1 % external solution 3 drops in right ear bid x 14d 30 mL 3  . FLUoxetine (PROZAC) 20 MG  tablet Take 1 tablet (20 mg total) by mouth daily. 90 tablet 3   No facility-administered medications prior to visit.     No Known Allergies  ROS Review of Systems  Constitutional: Positive for fatigue (intermittent). Negative for appetite change, chills and fever.  HENT: Negative for congestion, dental problem, ear pain and sore throat.   Eyes: Negative for discharge, redness and visual disturbance.  Respiratory: Negative for cough, chest tightness, shortness of breath and wheezing.   Cardiovascular: Negative for chest pain, palpitations and leg swelling.  Gastrointestinal: Negative for abdominal pain, blood in stool, diarrhea, nausea and vomiting.  Genitourinary: Negative for difficulty urinating, dysuria, flank pain, frequency, hematuria and urgency.  Musculoskeletal: Negative for arthralgias, back pain, joint swelling, myalgias and neck stiffness.  Skin: Negative for pallor and rash.  Neurological: Negative for dizziness, speech difficulty, weakness and headaches.  Hematological:  Negative for adenopathy. Does not bruise/bleed easily.  Psychiatric/Behavioral: Negative for confusion and sleep disturbance. The patient is not nervous/anxious.     PE; Blood pressure 124/79, pulse 73, temperature 98 F (36.7 C), temperature source Oral, resp. rate 16, height 5' 4.5" (1.638 m), weight 139 lb 6.4 oz (63.2 kg), last menstrual period 12/28/2013, SpO2 96 %.  Examination chaperoned by CMA Starla Link. Gen: Alert, well appearing.  Patient is oriented to person, place, time, and situation. AFFECT: pleasant, lucid thought and speech. ENT: Ears: EACs clear, normal epithelium.  TMs with good light reflex and landmarks bilaterally.  Eyes: no injection, icteris, swelling, or exudate.  EOMI, PERRLA. Nose: no drainage or turbinate edema/swelling.  No injection or focal lesion.  Mouth: lips without lesion/swelling.  Oral mucosa pink and moist.  Dentition intact and without obvious caries or gingival swelling.  Oropharynx without erythema, exudate, or swelling.  Neck: supple/nontender.  No LAD, mass, or TM.  Carotid pulses 2+ bilaterally, without bruits. CV: RRR, no m/r/g.   LUNGS: CTA bilat, nonlabored resps, good aeration in all lung fields. ABD: soft, mild TTP diffusely except in LUQ and subxyphoid region.  Most sensitive from umbillicus down to suprapubic region in midline.  No guarding or rebound, ND, BS normal.  No hepatospenomegaly or mass.  No bruits. EXT: no clubbing, cyanosis, or edema.  Musculoskeletal: no joint swelling, erythema, warmth, or tenderness.  ROM of all joints intact. Skin - no sores or suspicious lesions or rashes or color changes  Pertinent labs:  none  ASSESSMENT AND PLAN:   1) Lower abd pain: suspicious for brief bouts of diverticulitis vs IBS sx's. Watchful waiting at this time + fasting labs upcoming.  2) Health maintenance exam: Reviewed age and gender appropriate health maintenance issues (prudent diet, regular exercise, health risks of tobacco and  excessive alcohol, use of seatbelts, fire alarms in home, use of sunscreen).  Also reviewed age and gender appropriate health screening as well as vaccine recommendations. She declined flu vaccine. Colon ca screening: next colonoscopy due 03/2021. GYN: has next f/u visit with her GYN 12/2016 for cervical ca and breast ca screening. She'll return for fasting HP in future ASAP.  An After Visit Summary was printed and given to the patient.  FOLLOW UP:  Return in about 1 year (around 09/02/2017) for annual CPE (fasting).  Signed:  Crissie Sickles, MD           09/02/2016

## 2016-09-05 ENCOUNTER — Other Ambulatory Visit (INDEPENDENT_AMBULATORY_CARE_PROVIDER_SITE_OTHER): Payer: Commercial Managed Care - HMO

## 2016-09-05 DIAGNOSIS — Z0000 Encounter for general adult medical examination without abnormal findings: Secondary | ICD-10-CM | POA: Diagnosis not present

## 2016-09-05 LAB — COMPREHENSIVE METABOLIC PANEL WITH GFR
ALT: 10 U/L (ref 0–35)
AST: 11 U/L (ref 0–37)
Albumin: 4.1 g/dL (ref 3.5–5.2)
Alkaline Phosphatase: 72 U/L (ref 39–117)
BUN: 9 mg/dL (ref 6–23)
CO2: 30 meq/L (ref 19–32)
Calcium: 9.5 mg/dL (ref 8.4–10.5)
Chloride: 105 meq/L (ref 96–112)
Creatinine, Ser: 0.89 mg/dL (ref 0.40–1.20)
GFR: 70.97 mL/min
Glucose, Bld: 86 mg/dL (ref 70–99)
Potassium: 4.9 meq/L (ref 3.5–5.1)
Sodium: 140 meq/L (ref 135–145)
Total Bilirubin: 0.4 mg/dL (ref 0.2–1.2)
Total Protein: 6.8 g/dL (ref 6.0–8.3)

## 2016-09-05 LAB — CBC WITH DIFFERENTIAL/PLATELET
BASOS PCT: 0.4 % (ref 0.0–3.0)
Basophils Absolute: 0 10*3/uL (ref 0.0–0.1)
EOS PCT: 1.6 % (ref 0.0–5.0)
Eosinophils Absolute: 0.1 10*3/uL (ref 0.0–0.7)
HCT: 41.8 % (ref 36.0–46.0)
Hemoglobin: 14.2 g/dL (ref 12.0–15.0)
LYMPHS ABS: 2.5 10*3/uL (ref 0.7–4.0)
Lymphocytes Relative: 35.3 % (ref 12.0–46.0)
MCHC: 33.9 g/dL (ref 30.0–36.0)
MCV: 91.8 fl (ref 78.0–100.0)
MONOS PCT: 5 % (ref 3.0–12.0)
Monocytes Absolute: 0.4 10*3/uL (ref 0.1–1.0)
NEUTROS ABS: 4.1 10*3/uL (ref 1.4–7.7)
NEUTROS PCT: 57.7 % (ref 43.0–77.0)
PLATELETS: 292 10*3/uL (ref 150.0–400.0)
RBC: 4.56 Mil/uL (ref 3.87–5.11)
RDW: 13.3 % (ref 11.5–15.5)
WBC: 7.2 10*3/uL (ref 4.0–10.5)

## 2016-09-05 LAB — LIPID PANEL
Cholesterol: 204 mg/dL — ABNORMAL HIGH (ref 0–200)
HDL: 58.1 mg/dL
LDL Cholesterol: 126 mg/dL — ABNORMAL HIGH (ref 0–99)
NonHDL: 146.23
Total CHOL/HDL Ratio: 4
Triglycerides: 100 mg/dL (ref 0.0–149.0)
VLDL: 20 mg/dL (ref 0.0–40.0)

## 2016-09-05 LAB — TSH: TSH: 1.07 u[IU]/mL (ref 0.35–4.50)

## 2016-09-06 ENCOUNTER — Other Ambulatory Visit: Payer: Self-pay | Admitting: Family Medicine

## 2016-09-06 MED ORDER — HYOSCYAMINE SULFATE 0.125 MG SL SUBL: SUBLINGUAL_TABLET | SUBLINGUAL | 1 refills | Status: DC

## 2017-01-17 ENCOUNTER — Other Ambulatory Visit: Payer: Self-pay | Admitting: Family Medicine

## 2017-01-17 ENCOUNTER — Other Ambulatory Visit: Payer: Self-pay

## 2017-01-17 MED ORDER — HYOSCYAMINE SULFATE 0.125 MG SL SUBL: SUBLINGUAL_TABLET | SUBLINGUAL | 1 refills | Status: DC

## 2017-01-17 NOTE — Telephone Encounter (Signed)
Pt requesting refill of hyoscyamine (LEVSIN SL) 0.125 MG SL tablet.  Pharmacy: [id number] - [place], [place] [phone] (Phone) [phone] (Fax)

## 2017-01-17 NOTE — Telephone Encounter (Signed)
Refill for Levsin sent to pharmacy.

## 2017-01-17 NOTE — Telephone Encounter (Signed)
Medication refilled

## 2017-01-20 ENCOUNTER — Ambulatory Visit (INDEPENDENT_AMBULATORY_CARE_PROVIDER_SITE_OTHER): Payer: Commercial Managed Care - HMO | Admitting: Family Medicine

## 2017-01-20 ENCOUNTER — Encounter: Payer: Self-pay | Admitting: Family Medicine

## 2017-01-20 VITALS — BP 122/83 | HR 72 | Temp 97.7°F | Resp 16 | Wt 142.0 lb

## 2017-01-20 DIAGNOSIS — M533 Sacrococcygeal disorders, not elsewhere classified: Secondary | ICD-10-CM | POA: Diagnosis not present

## 2017-01-20 MED ORDER — CYCLOBENZAPRINE HCL 5 MG PO TABS: 5.0000 mg | ORAL_TABLET | Freq: Two times a day (BID) | ORAL | 1 refills | Status: DC | PRN

## 2017-01-20 MED ORDER — PREDNISONE 20 MG PO TABS: ORAL_TABLET | ORAL | 0 refills | Status: DC

## 2017-01-20 NOTE — Progress Notes (Signed)
Avianca Grainger , [birth year], 51 y.o., female MRN: [id number] Patient Care Team    Relationship Specialty Notifications Start End  Tammi Sou, MD PCP - General Family Medicine  06/21/15    Comment: Cristine Polio, MD  Family Medicine  06/21/15    Comment: Merged    CC: back pain Subjective: Pt presents for an acute OV with complaints of right sided low back pain  of 1 week duration.  Associated symptoms include an area of burning sensation and TTP right lower back. Pain is focal ot lower back,no radiation of pain. She has tried rest, ice/heat and tylenol/advil. This has helped some, but not resolved the issue. The pain is worse when standing long periods of time. She denies any current injury or increase in activity that could have caused injury. She states she has seen in chiropractor in 2009 with xrays that resulted in compressed disc. She states occasionally she will have lower back discomfort that resolves easily. She is worried this time bc she feels the "knot" is concerning.   No Known Allergies Social History  Substance Use Topics  . Smoking status: Current Every Day Smoker    Packs/day: 1.00    Years: 20.00    Types: E-cigarettes  . Smokeless tobacco: Never Used     Comment: e-vapor cigarettes -3 mg nicotine and does 3 0z a day   . Alcohol use 0.0 oz/week     Comment: socially   Past Medical History:  Diagnosis Date  . Anxiety   . Ear infection    Recurrent; lots of scar tissue, surgeries on TM, eventually TM removed.  Marland Kitchen History of adenomatous polyp of colon 03/2016   Recall 5 yrs (Dr. Havery Moros)  . History of pleurisy 1996  . Left breast mass 2015   Appears benign, no bx done, getting serial mammograms via [facility].  Marland Kitchen Positive skin test for tuberculosis age 80 approx   father had TB and died when she was 31; pt doesn't recall having CXR or getting TB tx.  . Rib fracture 12/2014   s/p fall  . Tobacco dependence   . UTI (urinary tract infection)     Past Surgical History:  Procedure Laterality Date  . COLONOSCOPY  03/18/2016   Tubular adenoma x 1.  Recall 5 yrs  . TUBAL LIGATION    . tympanectomy    . WISDOM TOOTH EXTRACTION     Family History  Problem Relation Age of Onset  . Breast cancer Mother   . Cancer Mother     uterine  . Hypertension Mother   . Stroke Mother   . Heart disease Mother   . COPD Mother   . Cancer Maternal Grandmother     uterine  . Stroke Maternal Grandmother   . Heart disease Maternal Grandmother   . Hypertension Maternal Grandmother   . Emphysema Father   . Tuberculosis Father   . Colon polyps Neg Hx   . Colon cancer Neg Hx   . Esophageal cancer Neg Hx   . Rectal cancer Neg Hx   . Stomach cancer Neg Hx    Allergies as of 01/20/2017   No Known Allergies     Medication List       Accurate as of 01/20/17 11:25 AM. Always use your most recent med list.          acetaminophen 500 MG tablet Commonly known as:  TYLENOL Take 500 mg by mouth every 6 (six)  hours as needed.   clotrimazole 1 % external solution Commonly known as:  LOTRIMIN 3 drops in right ear bid x 14d   FLUoxetine 20 MG tablet Commonly known as:  PROZAC Take 1 tablet (20 mg total) by mouth daily.   hyoscyamine 0.125 MG SL tablet Commonly known as:  LEVSIN SL 1-2 tabs po q6h prn abdominal pain       No results found for this or any previous visit (from the past 24 hour(s)). No results found.   ROS: Negative, with the exception of above mentioned in HPI   Objective:  BP 122/83 (BP Location: Left Arm, Patient Position: Sitting, Cuff Size: Normal)   Pulse 72   Temp 97.7 F (36.5 C) (Oral)   Resp 16   Wt 142 lb (64.4 kg)   LMP 12/28/2013   SpO2 97%   BMI 24.00 kg/m  Body mass index is 24 kg/m. Gen: Afebrile. No acute distress. Nontoxic in appearance, well developed, well nourished.  HENT: AT. [place]. MMM MSK/Neuro: Normal gait. No erythema or bruising. No TTP lumbar spine or paraspinal muscles. TTP right SI  joint.  + FABRE right. Neg SLR. Cranial nerves II through XII intact. Muscle strength 5/5 bilateral Lower extremity. DTRs equal bilaterally.   Assessment/Plan: Sundy Maidonado is a 51 y.o. female present for acute OV for  Sacroiliac dysfunction - rest, start light stretches in 1 week, using pain as her guide. Educated pt on SI dysfunction.  - predniSONE (DELTASONE) 20 MG tablet; 60 mg x3d, 40 mg x3d, 20 mg x2d, 10 mg x2d  Dispense: 18 tablet; Refill: 0 - cyclobenzaprine (FLEXERIL) 5 MG tablet; Take 1 tablet (5 mg total) by mouth 2 (two) times daily as needed for muscle spasms.  Dispense: 30 tablet; Refill: 1 - Follow up in 2-4 weeks if not improving or sooner if worsening.   electronically signed by:  Howard Pouch, DO  [place]

## 2017-01-20 NOTE — Patient Instructions (Addendum)
Sacroiliac Joint Dysfunction Introduction Sacroiliac joint dysfunction is a condition that causes inflammation on one or both sides of the sacroiliac (SI) joint. The SI joint connects the lower part of the spine (sacrum) with the two upper portions of the pelvis (ilium). This condition causes deep aching or burning pain in the low back. In some cases, the pain may also spread into one or both buttocks or hips or spread down the legs. What are the causes? This condition may be caused by:  Pregnancy. During pregnancy, extra stress is put on the SI joints because the pelvis widens.  Injury, such as:  Car accidents.  Sport-related injuries.  Work-related injuries.  Having one leg that is shorter than the other.  Conditions that affect the joints, such as:  Rheumatoid arthritis.  Gout.  Psoriatic arthritis.  Joint infection (septic arthritis). Sometimes, the cause of SI joint dysfunction is not known. What are the signs or symptoms? Symptoms of this condition include:  Aching or burning pain in the lower back. The pain may also spread to other areas, such as:  Buttocks.  Groin.  Thighs and legs.  Muscle spasms in or around the painful areas.  Increased pain when standing, walking, running, stair climbing, bending, or lifting.  How is this treated? Treatment may vary depending on the cause and severity of your condition. Treatment options may include:  Applying ice or heat to the lower back area. This can help to reduce pain and muscle spasms.  Medicines to relieve pain or inflammation or to relax the muscles.  Wearing a back brace (sacroiliac brace) to help support the joint while your back is healing.  Physical therapy to increase muscle strength around the joint and flexibility at the joint. This may also involve learning proper body positions and ways of moving to relieve stress on the joint.  Direct manipulation of the SI joint.  Injections of steroid medicine  into the joint in order to reduce pain and swelling.  Radiofrequency ablation to burn away nerves that are carrying pain messages from the joint.  Use of a device that provides electrical stimulation in order to reduce pain at the joint.  Surgery to put in screws and plates that limit or prevent joint motion. This is rare. Follow these instructions at home:  Rest as needed. Limit your activities as directed by your health care provider.  Take medicines only as directed by your health care provider.  If directed, apply ice to the affected area:  Put ice in a plastic bag.  Place a towel between your skin and the bag.  Leave the ice on for 20 minutes, 2-3 times per day.  Use a heating pad or a moist heat pack as directed by your health care provider.  Exercise as directed by your health care provider or physical therapist.  Keep all follow-up visits as directed by your health care provider. This is important. Contact a health care provider if:  Your pain is not controlled with medicine.  You have a fever.  You have increasingly severe pain. Get help right away if:  You have weakness, numbness, or tingling in your legs or feet.  You lose control of your bladder or bowel. This information is not intended to replace advice given to you by your health care provider. Make sure you discuss any questions you have with your health care provider. Document Released: 02/28/2009 Document Revised: 05/09/2016 Document Reviewed: 08/09/2014  2017 Elsevier   Stat prednisone taper.  Flexeril at  night. Can use one other time during the day if needed, caution with sedation.

## 2017-01-20 NOTE — [facility] Notes (Signed)
Pre visit review using our clinic review tool, if applicable. No additional management support is needed unless otherwise documented below in the visit note. 

## 2017-02-26 ENCOUNTER — Other Ambulatory Visit: Payer: Self-pay | Admitting: *Deleted

## 2017-02-26 DIAGNOSIS — M533 Sacrococcygeal disorders, not elsewhere classified: Secondary | ICD-10-CM

## 2017-02-26 MED ORDER — CYCLOBENZAPRINE HCL 5 MG PO TABS: 5.0000 mg | ORAL_TABLET | Freq: Two times a day (BID) | ORAL | 1 refills | Status: DC | PRN

## 2017-02-26 NOTE — Telephone Encounter (Signed)
I authorized RF of her cyclobenzaprine, but she needs f/u regarding use of this medication before any FURTHER RF's.-thx

## 2017-02-26 NOTE — Telephone Encounter (Signed)
[facility].  RF request for cyclobenzaprine LOV: 09/02/16 CPE Next ov:  None Last written: 01/20/17 #30 w/ 1RF  Please advise. Thanks.

## 2017-02-26 NOTE — Telephone Encounter (Signed)
Patient notified and verbalized understanding.  Patient stated that she didn't need medication at this time.

## 2017-03-02 ENCOUNTER — Other Ambulatory Visit: Payer: Self-pay | Admitting: Family Medicine

## 2017-05-23 ENCOUNTER — Ambulatory Visit (INDEPENDENT_AMBULATORY_CARE_PROVIDER_SITE_OTHER): Payer: Commercial Managed Care - HMO | Admitting: Family Medicine

## 2017-05-23 VITALS — BP 118/75 | HR 71 | Temp 97.9°F | Resp 16 | Ht 64.5 in | Wt 148.0 lb

## 2017-05-23 DIAGNOSIS — R635 Abnormal weight gain: Secondary | ICD-10-CM

## 2017-05-23 DIAGNOSIS — M25541 Pain in joints of right hand: Secondary | ICD-10-CM

## 2017-05-23 DIAGNOSIS — M25542 Pain in joints of left hand: Secondary | ICD-10-CM

## 2017-05-23 DIAGNOSIS — M791 Myalgia, unspecified site: Secondary | ICD-10-CM

## 2017-05-23 DIAGNOSIS — Z13220 Encounter for screening for lipoid disorders: Secondary | ICD-10-CM | POA: Diagnosis not present

## 2017-05-23 DIAGNOSIS — R21 Rash and other nonspecific skin eruption: Secondary | ICD-10-CM | POA: Diagnosis not present

## 2017-05-23 LAB — CBC WITH DIFFERENTIAL/PLATELET
BASOS PCT: 1 %
Basophils Absolute: 62 cells/uL (ref 0–200)
EOS ABS: 124 {cells}/uL (ref 15–500)
Eosinophils Relative: 2 %
HEMATOCRIT: 42.2 % (ref 35.0–45.0)
Hemoglobin: 13.9 g/dL (ref 11.7–15.5)
Lymphocytes Relative: 39 %
Lymphs Abs: 2418 cells/uL (ref 850–3900)
MCH: 29.9 pg (ref 27.0–33.0)
MCHC: 32.9 g/dL (ref 32.0–36.0)
MCV: 90.8 fL (ref 80.0–100.0)
MONO ABS: 372 {cells}/uL (ref 200–950)
MPV: 10.5 fL (ref 7.5–12.5)
Monocytes Relative: 6 %
NEUTROS PCT: 52 %
Neutro Abs: 3224 cells/uL (ref 1500–7800)
Platelets: 312 10*3/uL (ref 140–400)
RBC: 4.65 MIL/uL (ref 3.80–5.10)
RDW: 14 % (ref 11.0–15.0)
WBC: 6.2 10*3/uL (ref 3.8–10.8)

## 2017-05-23 LAB — TSH: TSH: 1.37 mIU/L

## 2017-05-23 MED ORDER — FLUOXETINE HCL 20 MG PO TABS: 20.0000 mg | ORAL_TABLET | Freq: Every day | ORAL | 0 refills | Status: DC

## 2017-05-23 MED ORDER — FLUOXETINE HCL 20 MG PO TABS: 20.0000 mg | ORAL_TABLET | Freq: Every day | ORAL | 3 refills | Status: DC

## 2017-05-23 MED ORDER — METRONIDAZOLE 0.75 % EX GEL: 1.0000 "application " | Freq: Two times a day (BID) | CUTANEOUS | 1 refills | Status: DC

## 2017-05-23 NOTE — Progress Notes (Signed)
OFFICE VISIT  05/26/2017   CC:  Chief Complaint  Patient presents with  . Follow-up    for fluoxetine, pt feels she has some side effects from this medication   HPI:    Patient is a 51 y.o. Caucasian female who presents for f/u anxiety (GAD/panic). I have not seen her in 15 months.  At that time she had signif improvement of her anxiety on fluoxetine 71m qd and was having only some slight tremulousness in hands on the med. Her anxiety continues to be well controlled on this med.  Has noted a rash come out about 10 d/a, started with pink, flaky splotches of itchy skin on face.  Started on bridge of nose and has spread to eyebrow region and bilat perinasal regions.  Applying neosporin and aquafor--no signif response noted.  No rash elsewhere. Has never had this rash on face before.  Has noted wt gain of about 8-10 lbs despite not changing her diet and exercise regimen. Some achiness in body, particularly hips.  No fevers.  No oral ulcers.   Past Medical History:  Diagnosis Date  . Anxiety   . Ear infection    Recurrent; lots of scar tissue, surgeries on TM, eventually TM removed.  .Marland KitchenHistory of adenomatous polyp of colon 03/2016   Recall 5 yrs (Dr. AHavery Moros  . History of pleurisy 1996  . Left breast mass 2015   Appears benign, no bx done, getting serial mammograms via [facility].  .Marland KitchenPositive skin test for tuberculosis age [age]approx   father had TB and died when she was 89 pt doesn't recall having CXR or getting TB tx.  . Rib fracture 12/2014   s/p fall  . Tobacco dependence   . UTI (urinary tract infection)     Past Surgical History:  Procedure Laterality Date  . COLONOSCOPY  03/18/2016   Tubular adenoma x 1.  Recall 5 yrs  . TUBAL LIGATION    . tympanectomy    . WISDOM TOOTH EXTRACTION      Outpatient Medications Prior to Visit  Medication Sig Dispense Refill  . acetaminophen (TYLENOL) 500 MG tablet Take 500 mg by mouth every 6 (six) hours as needed.    .  clotrimazole (LOTRIMIN) 1 % external solution 3 drops in right ear bid x 14d 30 mL 3  . hyoscyamine (LEVSIN SL) 0.125 MG SL tablet 1-2 tabs po q6h prn abdominal pain 30 tablet 1  . FLUoxetine (PROZAC) 20 MG tablet TAKE 1 TABLET BY MOUTH  DAILY 90 tablet 1  . cyclobenzaprine (FLEXERIL) 5 MG tablet Take 1 tablet (5 mg total) by mouth 2 (two) times daily as needed for muscle spasms. (Patient not taking: Reported on 05/23/2017) 30 tablet 1  . predniSONE (DELTASONE) 20 MG tablet 60 mg x3d, 40 mg x3d, 20 mg x2d, 10 mg x2d (Patient not taking: Reported on 05/23/2017) 18 tablet 0   No facility-administered medications prior to visit.     No Known Allergies  ROS As per HPI  PE: Blood pressure 118/75, pulse 71, temperature 97.9 F (36.6 C), temperature source Oral, resp. rate 16, height 5' 4.5" (1.638 m), weight 148 lb (67.1 kg), last menstrual period 12/28/2013, SpO2 96 %. Body mass index is 25.01 kg/m.  Gen: Alert, well appearing.  Patient is oriented to person, place, time, and situation. AFFECT: pleasant, lucid thought and speech. EGNF:AOZH no injection, icteris, swelling, or exudate.  EOMI, PERRLA. Mouth: lips without lesion/swelling.  Oral mucosa pink and moist.  Oropharynx without erythema, exudate, or swelling.  Neck - No masses or thyromegaly or limitation in range of motion CV: RRR, no m/r/g.   LUNGS: CTA bilat, nonlabored resps, good aeration in all lung fields. EXT: no clubbing, cyanosis, or edema.  SKIN: face with pinkish splotches of maculopapalar rash in malar distribution.  No significant scaling or flaking.  Nontender.  Mildly blanchable. MUSC: ROM of joints intact.  No warmth, erythema, or swelling of joints. Mild TTP of greater troch regions bilat as well as wrists and hands bilat.    LABS:  Lab Results  Component Value Date   TSH 1.37 05/23/2017     Chemistry      Component Value Date/Time   NA 138 05/23/2017 1200   K 4.2 05/23/2017 1200   CL 102 05/23/2017 1200   CO2  23 05/23/2017 1200   BUN 11 05/23/2017 1200   CREATININE 0.72 05/23/2017 1200      Component Value Date/Time   CALCIUM 9.8 05/23/2017 1200   ALKPHOS 68 05/23/2017 1200   AST 14 05/23/2017 1200   ALT 14 05/23/2017 1200   BILITOT 0.4 05/23/2017 1200     Lab Results  Component Value Date   CHOL 278 (H) 05/23/2017   HDL 67 05/23/2017   LDLCALC 184 (H) 05/23/2017   TRIG 135 05/23/2017   CHOLHDL 4.1 05/23/2017    IMPRESSION AND PLAN:  1) Anxiety: doing well on fluoxetine and despite some wt gain that we feel MAY be secondary to this med, she wants to continue the med since it is working so well for her.  Continue to work on [place].  2) Facial rash: ?Malar rash?  Will treat as mild rosacea with metronidazole gel. Given arthralgias in hands + ? Malar rash, will do some rheum/autoimmune labs (ANA, CRP, Rh factor and CCP, ESR, TSH, and also check CBC and CMET.  3) Screening for hyperlipidemia: FLP today.  An After Visit Summary was printed and given to the patient.  FOLLOW UP: Return in about 6 months (around 11/22/2017) for annual CPE (fasting).  Signed:  Crissie Sickles, MD           05/26/2017

## 2017-05-24 LAB — COMPREHENSIVE METABOLIC PANEL
ALBUMIN: 4.4 g/dL (ref 3.6–5.1)
ALT: 14 U/L (ref 6–29)
AST: 14 U/L (ref 10–35)
Alkaline Phosphatase: 68 U/L (ref 33–130)
BUN: 11 mg/dL (ref 7–25)
CALCIUM: 9.8 mg/dL (ref 8.6–10.4)
CHLORIDE: 102 mmol/L (ref 98–110)
CO2: 23 mmol/L (ref 20–31)
Creat: 0.72 mg/dL (ref 0.50–1.05)
Glucose, Bld: 89 mg/dL (ref 65–99)
POTASSIUM: 4.2 mmol/L (ref 3.5–5.3)
Sodium: 138 mmol/L (ref 135–146)
TOTAL PROTEIN: 7.1 g/dL (ref 6.1–8.1)
Total Bilirubin: 0.4 mg/dL (ref 0.2–1.2)

## 2017-05-24 LAB — LIPID PANEL
CHOL/HDL RATIO: 4.1 ratio (ref <5.0)
Cholesterol: 278 mg/dL — ABNORMAL HIGH (ref <200)
HDL: 67 mg/dL (ref >50)
LDL Cholesterol: 184 mg/dL — ABNORMAL HIGH (ref <100)
TRIGLYCERIDES: 135 mg/dL (ref <150)
VLDL: 27 mg/dL (ref <30)

## 2017-05-24 LAB — SEDIMENTATION RATE: Sed Rate: 11 mm/hr (ref 0–30)

## 2017-05-26 ENCOUNTER — Encounter: Payer: Self-pay | Admitting: Family Medicine

## 2017-05-26 LAB — ANTINUCLEAR ANTIBODIES, IFA: ANA Titer 1: NEGATIVE

## 2017-05-26 LAB — CYCLIC CITRUL PEPTIDE ANTIBODY, IGG

## 2017-05-27 LAB — RHEUMATOID FACTOR: Rhuematoid fact SerPl-aCnc: <14 IU/mL (ref <14)

## 2017-05-27 LAB — C-REACTIVE PROTEIN: CRP: 8.5 mg/L — ABNORMAL HIGH (ref <8.0)

## 2017-05-29 ENCOUNTER — Encounter: Payer: Self-pay | Admitting: *Deleted

## 2017-06-19 ENCOUNTER — Telehealth: Payer: Self-pay | Admitting: Family Medicine

## 2017-06-19 NOTE — Telephone Encounter (Signed)
Patient states out of pocket cost of FLUoxetine (PROZAC) 20 MG tablet as increased from $26 to $126 due to chang in tiers. She is requesting a cheaper medication be called in to [facility].  I advised patient to contact insurance company to see what the preferred alternative would be on their forumulary.  Patient states she will contact insurance company and find out and will call us back with the preferred medication.   Pharmacy: [place], [place] [employer] [phone] (Phone) [phone] (Fax)

## 2017-06-25 ENCOUNTER — Other Ambulatory Visit: Payer: Self-pay | Admitting: Family Medicine

## 2017-07-02 ENCOUNTER — Other Ambulatory Visit: Payer: Self-pay | Admitting: *Deleted

## 2017-07-02 NOTE — Telephone Encounter (Signed)
Opened in error

## 2017-07-20 ENCOUNTER — Other Ambulatory Visit: Payer: Self-pay | Admitting: Family Medicine

## 2017-07-25 ENCOUNTER — Encounter: Payer: Self-pay | Admitting: *Deleted

## 2017-07-25 DIAGNOSIS — Z1239 Encounter for other screening for malignant neoplasm of breast: Secondary | ICD-10-CM

## 2017-07-25 NOTE — Telephone Encounter (Signed)
Please advise. Thanks.  

## 2017-07-27 NOTE — Telephone Encounter (Signed)
Pls find out where pt wants her mammogram and order this test under dx of breast ca screening.-thx

## 2017-08-23 ENCOUNTER — Telehealth: Payer: Commercial Managed Care - HMO | Admitting: Family

## 2017-08-23 DIAGNOSIS — N390 Urinary tract infection, site not specified: Secondary | ICD-10-CM

## 2017-08-23 MED ORDER — CEPHALEXIN 500 MG PO CAPS: 500.0000 mg | ORAL_CAPSULE | Freq: Two times a day (BID) | ORAL | 0 refills | Status: DC

## 2017-08-23 NOTE — Progress Notes (Signed)
Thank you for the details you put in the comment boxes. Those details really help us take better care of you.   We are sorry that you are not feeling well.  Here is how we plan to help!  Based on what you shared with me it looks like you most likely have a simple urinary tract infection.  A UTI (Urinary Tract Infection) is a bacterial infection of the bladder.  Most cases of urinary tract infections are simple to treat but a key part of your care is to encourage you to drink plenty of fluids and watch your symptoms carefully.  I have prescribed Keflex 500 mg twice a day for 7 days.  Your symptoms should gradually improve. Call us if the burning in your urine worsens, you develop worsening fever, back pain or pelvic pain or if your symptoms do not resolve after completing the antibiotic.  Urinary tract infections can be prevented by drinking plenty of water to keep your body hydrated.  Also be sure when you wipe, wipe from front to back and don't hold it in!  If possible, empty your bladder every 4 hours.  Your e-visit answers were reviewed by a board certified advanced clinical practitioner to complete your personal care plan.  Depending on the condition, your plan could have included both over the counter or prescription medications.  If there is a problem please reply  once you have received a response from your provider.  Your safety is important to us.  If you have drug allergies check your prescription carefully.    You can use MyChart to ask questions about today's visit, request a non-urgent call back, or ask for a work or school excuse for 24 hours related to this e-Visit. If it has been greater than 24 hours you will need to follow up with your provider, or enter a new e-Visit to address those concerns.   You will get an e-mail in the next two days asking about your experience.  I hope that your e-visit has been valuable and will speed your recovery. Thank you for using  e-visits.    

## 2017-08-25 ENCOUNTER — Telehealth: Payer: Self-pay | Admitting: *Deleted

## 2017-08-25 DIAGNOSIS — Z87898 Personal history of other specified conditions: Secondary | ICD-10-CM

## 2017-08-25 NOTE — Telephone Encounter (Signed)
-----   Message from Nickola Major sent at 08/25/2017  8:38 AM EDT ----- [facility] is asking that the patient's order be entered as Bilateral Diagnostic mammo & they need an order for an [place]. Patient's last mammo was diagnostic & it was recommended that the followup one to be one as well. Thanks

## 2017-08-25 NOTE — Telephone Encounter (Signed)
New orders placed

## 2017-08-26 ENCOUNTER — Other Ambulatory Visit: Payer: Self-pay | Admitting: Family Medicine

## 2017-08-26 DIAGNOSIS — N6320 Unspecified lump in the left breast, unspecified quadrant: Secondary | ICD-10-CM

## 2017-08-27 ENCOUNTER — Other Ambulatory Visit: Payer: Self-pay | Admitting: Family Medicine

## 2017-08-27 DIAGNOSIS — N6320 Unspecified lump in the left breast, unspecified quadrant: Secondary | ICD-10-CM

## 2017-09-09 ENCOUNTER — Other Ambulatory Visit: Payer: Self-pay | Admitting: Family Medicine

## 2017-09-09 ENCOUNTER — Ambulatory Visit
Admission: RE | Admit: 2017-09-09 | Discharge: 2017-09-09 | Disposition: A | Discharge status: Home / Self Care | Payer: 59 | Source: Ambulatory Visit | Attending: Family Medicine | Admitting: Family Medicine

## 2017-09-09 DIAGNOSIS — N6320 Unspecified lump in the left breast, unspecified quadrant: Secondary | ICD-10-CM

## 2017-09-09 DIAGNOSIS — N6001 Solitary cyst of right breast: Secondary | ICD-10-CM | POA: Diagnosis not present

## 2017-09-09 DIAGNOSIS — R922 Inconclusive mammogram: Secondary | ICD-10-CM | POA: Diagnosis not present

## 2017-09-09 DIAGNOSIS — N6322 Unspecified lump in the left breast, upper inner quadrant: Secondary | ICD-10-CM | POA: Diagnosis not present

## 2017-09-09 DIAGNOSIS — N63 Unspecified lump in unspecified breast: Secondary | ICD-10-CM

## 2017-09-10 ENCOUNTER — Encounter: Payer: Self-pay | Admitting: Family Medicine

## 2017-11-10 ENCOUNTER — Other Ambulatory Visit: Payer: Self-pay

## 2017-11-10 ENCOUNTER — Other Ambulatory Visit (HOSPITAL_COMMUNITY)
Admission: RE | Admit: 2017-11-10 | Discharge: 2017-11-10 | Disposition: A | Discharge status: Home / Self Care | Payer: 59 | Source: Ambulatory Visit | Attending: Family Medicine | Admitting: Family Medicine

## 2017-11-10 ENCOUNTER — Encounter: Payer: Self-pay | Admitting: Family Medicine

## 2017-11-10 ENCOUNTER — Ambulatory Visit (INDEPENDENT_AMBULATORY_CARE_PROVIDER_SITE_OTHER): Payer: 59 | Admitting: Family Medicine

## 2017-11-10 VITALS — BP 137/86 | HR 81 | Temp 97.9°F | Resp 16 | Ht 64.5 in | Wt 155.0 lb

## 2017-11-10 DIAGNOSIS — E7800 Pure hypercholesterolemia, unspecified: Secondary | ICD-10-CM | POA: Diagnosis not present

## 2017-11-10 DIAGNOSIS — Z23 Encounter for immunization: Secondary | ICD-10-CM

## 2017-11-10 DIAGNOSIS — Z124 Encounter for screening for malignant neoplasm of cervix: Secondary | ICD-10-CM | POA: Diagnosis not present

## 2017-11-10 LAB — HEPATIC FUNCTION PANEL
ALBUMIN: 4.3 g/dL (ref 3.5–5.2)
ALK PHOS: 62 U/L (ref 39–117)
ALT: 12 U/L (ref 0–35)
AST: 13 U/L (ref 0–37)
Bilirubin, Direct: 0 mg/dL (ref 0.0–0.3)
TOTAL PROTEIN: 7.1 g/dL (ref 6.0–8.3)
Total Bilirubin: 0.4 mg/dL (ref 0.2–1.2)

## 2017-11-10 LAB — LIPID PANEL
CHOLESTEROL: 279 mg/dL — AB (ref 0–200)
HDL: 54.5 mg/dL (ref >39.00)
LDL Cholesterol: 190 mg/dL — ABNORMAL HIGH (ref 0–99)
NonHDL: 224.99
TRIGLYCERIDES: 175 mg/dL — AB (ref 0.0–149.0)
Total CHOL/HDL Ratio: 5
VLDL: 35 mg/dL (ref 0.0–40.0)

## 2017-11-10 NOTE — Progress Notes (Signed)
Office Note 11/10/2017  CC:  Chief Complaint  Patient presents with  . Annual Exam    Pt is fasting.     HPI:  Christy [name] is a 51 y.o. White female who is here for annual health maintenance exam. Does not have GYN per her report today, is due for pap smear so we'll do this today.   Past Medical History:  Diagnosis Date  . Anxiety   . Ear infection    Recurrent; lots of scar tissue, surgeries on TM, eventually TM removed.  Marland Kitchen History of adenomatous polyp of colon 03/2016   Recall 5 yrs (Dr. Havery Moros)  . History of pleurisy 1996  . Left breast mass 2015   Appears benign, no bx done, getting serial mammograms via [facility].  stable as of 08/2017.  Repeat imaging 6 mo.  Marland Kitchen Positive skin test for tuberculosis age 73 approx   father had TB and died when she was 87; pt doesn't recall having CXR or getting TB tx.  . Rib fracture 12/2014   s/p fall  . Tobacco dependence   . UTI (urinary tract infection)     Past Surgical History:  Procedure Laterality Date  . COLONOSCOPY  03/18/2016   Tubular adenoma x 1.  Recall 5 yrs  . TUBAL LIGATION    . tympanectomy    . WISDOM TOOTH EXTRACTION      Family History  Problem Relation Age of Onset  . Breast cancer Mother   . Cancer Mother        uterine  . Hypertension Mother   . Stroke Mother   . Heart disease Mother   . COPD Mother   . Cancer Maternal Grandmother        uterine  . Stroke Maternal Grandmother   . Heart disease Maternal Grandmother   . Hypertension Maternal Grandmother   . Emphysema Father   . Tuberculosis Father   . Colon polyps Neg Hx   . Colon cancer Neg Hx   . Esophageal cancer Neg Hx   . Rectal cancer Neg Hx   . Stomach cancer Neg Hx     Social History   Socioeconomic History  . Marital status: Married    Spouse name: Not on file  . Number of children: Not on file  . Years of education: Not on file  . Highest education level: Not on file  Social Needs  . Financial resource strain:  Not on file  . Food insecurity - worry: Not on file  . Food insecurity - inability: Not on file  . Transportation needs - medical: Not on file  . Transportation needs - non-medical: Not on file  Occupational History  . Not on file  Tobacco Use  . Smoking status: Current Every Day Smoker    Packs/day: 1.00    Years: 20.00    Pack years: 20.00    Types: E-cigarettes  . Smokeless tobacco: Never Used  . Tobacco comment: e-vapor cigarettes -3 mg nicotine and does 3 0z a day   Substance and Sexual Activity  . Alcohol use: Yes    Alcohol/week: 0.0 oz    Comment: socially  . Drug use: No  . Sexual activity: Yes    Birth control/protection: Surgical  Other Topics Concern  . Not on file  Social History Narrative   Married, 3 children.   Housewife.   Orig from [place], relocated to [facility] [place] [facility] approx 2007.   +Smoker.    Outpatient Medications Prior to Visit  Medication Sig Dispense Refill  . acetaminophen (TYLENOL) 500 MG tablet Take 500 mg by mouth every 6 (six) hours as needed.    . clotrimazole (LOTRIMIN) 1 % external solution 3 drops in right ear bid x 14d 30 mL 3  . hyoscyamine (LEVSIN SL) 0.125 MG SL tablet 1-2 tabs po q6h prn abdominal pain 30 tablet 1  . metroNIDAZOLE (METROGEL) 0.75 % gel Apply 1 application topically 2 (two) times daily. 45 g 1  . cephALEXin (KEFLEX) 500 MG capsule Take 1 capsule (500 mg total) by mouth 2 (two) times daily. (Patient not taking: Reported on 11/10/2017) 14 capsule 0  . FLUoxetine (PROZAC) 20 MG tablet Take 1 tablet (20 mg total) by mouth daily. (Patient not taking: Reported on 11/10/2017) 30 tablet 0  . FLUoxetine (PROZAC) 20 MG tablet TAKE 1 TABLET BY MOUTH EVERY DAY (Patient not taking: Reported on 11/10/2017) 30 tablet 6   No facility-administered medications prior to visit.     No Known Allergies  ROS Review of Systems  Constitutional: Negative for appetite change, chills, fatigue and fever.  HENT: Negative for congestion, dental problem,  ear pain and sore throat.   Eyes: Negative for discharge, redness and visual disturbance.  Respiratory: Negative for cough, chest tightness, shortness of breath and wheezing.   Cardiovascular: Negative for chest pain, palpitations and leg swelling.  Gastrointestinal: Negative for abdominal pain, blood in stool, diarrhea, nausea and vomiting.  Genitourinary: Negative for difficulty urinating, dysuria, flank pain, frequency, hematuria and urgency.  Musculoskeletal: Negative for arthralgias, back pain, joint swelling, myalgias and neck stiffness.  Skin: Negative for pallor and rash.  Neurological: Negative for dizziness, speech difficulty, weakness and headaches.  Hematological: Negative for adenopathy. Does not bruise/bleed easily.  Psychiatric/Behavioral: Negative for confusion and sleep disturbance. The patient is not nervous/anxious.     PE; Blood pressure 137/86, pulse 81, temperature 97.9 F (36.6 C), temperature source Oral, resp. rate 16, height 5' 4.5" (1.638 m), weight 155 lb (70.3 kg), last menstrual period 12/28/2013, SpO2 97 %.  Pt examined with Sharen Hones, CMA, as chaperone.  Gen: Alert, well appearing.  Patient is oriented to person, place, time, and situation. AFFECT: pleasant, lucid thought and speech. ENT: Ears: EACs clear, normal epithelium.  TMs with good light reflex and landmarks bilaterally.  Eyes: no injection, icteris, swelling, or exudate.  EOMI, PERRLA. Nose: no drainage or turbinate edema/swelling.  No injection or focal lesion.  Mouth: lips without lesion/swelling.  Oral mucosa pink and moist.  Dentition intact and without obvious caries or gingival swelling.  Oropharynx without erythema, exudate, or swelling.  Neck: supple/nontender.  No LAD, mass, or TM.  Carotid pulses 2+ bilaterally, without bruits. CV: RRR, no m/r/g.   LUNGS: CTA bilat, nonlabored resps, good aeration in all lung fields. ABD: soft, NT, ND, BS normal.  No hepatospenomegaly or mass.  No  bruits. EXT: no clubbing or cyanosis.  She has trace pitting edema in both LL's from pretibial level into ankles.  Musculoskeletal: no joint swelling, erythema, warmth, or tenderness.  ROM of all joints intact. Skin - no sores or suspicious lesions or rashes or color changes Vagina and vulva are normal;  Small amount of milky discharge is noted.  Cervix normal without lesions. Uterus anteverted and mobile, normal in size and shape without tenderness.  Adnexa normal in size without masses or tenderness. Pap Smear - is completed today. Exam chaperoned by female assistant.   Pertinent labs:  Lab Results  Component Value Date  TSH 1.37 05/23/2017   Lab Results  Component Value Date   WBC 6.2 05/23/2017   HGB 13.9 05/23/2017   HCT 42.2 05/23/2017   MCV 90.8 05/23/2017   PLT 312 05/23/2017   Lab Results  Component Value Date   CREATININE 0.72 05/23/2017   BUN 11 05/23/2017   NA 138 05/23/2017   K 4.2 05/23/2017   CL 102 05/23/2017   CO2 23 05/23/2017   Lab Results  Component Value Date   ALT 14 05/23/2017   AST 14 05/23/2017   ALKPHOS 68 05/23/2017   BILITOT 0.4 05/23/2017   Lab Results  Component Value Date   CHOL 278 (H) 05/23/2017   Lab Results  Component Value Date   HDL 67 05/23/2017   Lab Results  Component Value Date   LDLCALC 184 (H) 05/23/2017   Lab Results  Component Value Date   TRIG 135 05/23/2017   Lab Results  Component Value Date   CHOLHDL 4.1 05/23/2017     ASSESSMENT AND PLAN:   Health maintenance exam: Reviewed age and gender appropriate health maintenance issues (prudent diet, regular exercise, health risks of tobacco and excessive alcohol, use of seatbelts, fire alarms in home, use of sunscreen).  Also reviewed age and gender appropriate health screening as well as vaccine recommendations. Vaccines: flu vaccine-- declined.    Shingrix--#1 given today. Labs: FLP, hepatic panel. Cerv ca screening: pap says most recent pap 2014.  Repeat pap  done today.  Pt w/out hx of abnormal pap.  Mother and GM with hx of uterine ca but not cervical ca. Breast ca screening: abnl mammo 08/2017; f/u diagnostic mammo benign.  Six month repeat recommended to assure stability--this is already ordered. Colon ca screening: next colonoscopy 03/2021.  An After Visit Summary was printed and given to the patient.  FOLLOW UP:  Return in about 1 year (around 11/10/2018) for annual CPE (fasting).  Signed:  Crissie Sickles, MD           11/10/2017

## 2017-11-10 NOTE — Addendum Note (Signed)
Addended by: Onalee Hua on: 11/10/2017 09:27 AM   Modules accepted: Orders

## 2017-11-10 NOTE — Patient Instructions (Signed)

## 2017-11-11 ENCOUNTER — Other Ambulatory Visit: Payer: Self-pay | Admitting: *Deleted

## 2017-11-11 ENCOUNTER — Encounter: Payer: Self-pay | Admitting: *Deleted

## 2017-11-11 DIAGNOSIS — E785 Hyperlipidemia, unspecified: Secondary | ICD-10-CM

## 2017-11-11 MED ORDER — ATORVASTATIN CALCIUM 20 MG PO TABS: 20.0000 mg | ORAL_TABLET | Freq: Every day | ORAL | 2 refills | Status: DC

## 2017-11-12 ENCOUNTER — Encounter: Payer: Self-pay | Admitting: Family Medicine

## 2017-11-12 LAB — CYTOLOGY - PAP
DIAGNOSIS: NEGATIVE
HPV (WINDOPATH): NOT DETECTED

## 2017-11-12 LAB — HM PAP SMEAR: HM Pap smear: NEGATIVE

## 2017-12-05 ENCOUNTER — Encounter (HOSPITAL_COMMUNITY): Payer: Self-pay

## 2018-02-13 ENCOUNTER — Telehealth: Payer: 59 | Admitting: Family

## 2018-02-13 DIAGNOSIS — N390 Urinary tract infection, site not specified: Secondary | ICD-10-CM

## 2018-02-13 MED ORDER — CEPHALEXIN 500 MG PO CAPS: 500.0000 mg | ORAL_CAPSULE | Freq: Two times a day (BID) | ORAL | 0 refills | Status: DC

## 2018-02-13 NOTE — Progress Notes (Signed)

## 2018-03-10 ENCOUNTER — Other Ambulatory Visit: Payer: Self-pay | Admitting: Family Medicine

## 2018-03-10 ENCOUNTER — Ambulatory Visit
Admission: RE | Admit: 2018-03-10 | Discharge: 2018-03-10 | Disposition: A | Discharge status: Home / Self Care | Payer: 59 | Source: Ambulatory Visit | Attending: Family Medicine | Admitting: Family Medicine

## 2018-03-10 DIAGNOSIS — N63 Unspecified lump in unspecified breast: Secondary | ICD-10-CM

## 2018-03-10 DIAGNOSIS — R922 Inconclusive mammogram: Secondary | ICD-10-CM | POA: Diagnosis not present

## 2018-03-10 DIAGNOSIS — N6320 Unspecified lump in the left breast, unspecified quadrant: Secondary | ICD-10-CM | POA: Diagnosis not present

## 2018-03-11 ENCOUNTER — Encounter: Payer: Self-pay | Admitting: Family Medicine

## 2018-03-11 ENCOUNTER — Telehealth: Payer: 59 | Admitting: Family

## 2018-03-11 ENCOUNTER — Ambulatory Visit: Payer: Self-pay | Admitting: Family Medicine

## 2018-03-11 VITALS — BP 121/86 | HR 73 | Temp 97.9°F | Resp 20 | Ht 64.0 in | Wt 156.0 lb

## 2018-03-11 DIAGNOSIS — R112 Nausea with vomiting, unspecified: Secondary | ICD-10-CM

## 2018-03-11 DIAGNOSIS — R109 Unspecified abdominal pain: Secondary | ICD-10-CM

## 2018-03-11 DIAGNOSIS — N390 Urinary tract infection, site not specified: Secondary | ICD-10-CM

## 2018-03-11 LAB — BASIC METABOLIC PANEL
BUN: 14 mg/dL (ref 6–23)
CALCIUM: 9.8 mg/dL (ref 8.4–10.5)
CHLORIDE: 101 meq/L (ref 96–112)
CO2: 29 meq/L (ref 19–32)
Creatinine, Ser: 0.75 mg/dL (ref 0.40–1.20)
GFR: 85.96 mL/min (ref >60.00)
GLUCOSE: 109 mg/dL — AB (ref 70–99)
Potassium: 4.4 mEq/L (ref 3.5–5.1)
Sodium: 140 mEq/L (ref 135–145)

## 2018-03-11 LAB — CBC WITH DIFFERENTIAL/PLATELET
Basophils Absolute: 0.1 10*3/uL (ref 0.0–0.1)
Basophils Relative: 0.6 % (ref 0.0–3.0)
EOS PCT: 1 % (ref 0.0–5.0)
Eosinophils Absolute: 0.1 10*3/uL (ref 0.0–0.7)
HEMATOCRIT: 41.9 % (ref 36.0–46.0)
HEMOGLOBIN: 14.3 g/dL (ref 12.0–15.0)
LYMPHS PCT: 21.7 % (ref 12.0–46.0)
Lymphs Abs: 2.2 10*3/uL (ref 0.7–4.0)
MCHC: 34 g/dL (ref 30.0–36.0)
MCV: 89.9 fl (ref 78.0–100.0)
MONOS PCT: 5.3 % (ref 3.0–12.0)
Monocytes Absolute: 0.5 10*3/uL (ref 0.1–1.0)
Neutro Abs: 7.2 10*3/uL (ref 1.4–7.7)
Neutrophils Relative %: 71.4 % (ref 43.0–77.0)
Platelets: 296 10*3/uL (ref 150.0–400.0)
RBC: 4.66 Mil/uL (ref 3.87–5.11)
RDW: 13.9 % (ref 11.5–15.5)
WBC: 10.1 10*3/uL (ref 4.0–10.5)

## 2018-03-11 LAB — POC URINALSYSI DIPSTICK (AUTOMATED)
Bilirubin, UA: NEGATIVE
Blood, UA: NEGATIVE
Glucose, UA: NEGATIVE
KETONES UA: NEGATIVE
LEUKOCYTES UA: NEGATIVE
NITRITE UA: NEGATIVE
PROTEIN UA: NEGATIVE
Spec Grav, UA: 1.02 (ref 1.010–1.025)
UROBILINOGEN UA: 0.2 U/dL
pH, UA: 7.5 (ref 5.0–8.0)

## 2018-03-11 MED ORDER — ONDANSETRON HCL 4 MG PO TABS: 4.0000 mg | ORAL_TABLET | Freq: Three times a day (TID) | ORAL | 0 refills | Status: DC | PRN

## 2018-03-11 MED ORDER — CIPROFLOXACIN HCL 500 MG PO TABS: 500.0000 mg | ORAL_TABLET | Freq: Two times a day (BID) | ORAL | 0 refills | Status: DC

## 2018-03-11 NOTE — Patient Instructions (Signed)
Start Cipro today and take every 12 hours for 5 days.  Zofran, for nausea, take every 8 hours for 24-48 hours, then as needed. PUSH fluids (water) to hydrate.  Take aleve once daily.  I will call you once labs come in.  If worsening please go to ED.     Pyelonephritis, Adult Pyelonephritis is a kidney infection. The kidneys are organs that help clean your blood by moving waste out of your blood and into your pee (urine). This infection can happen quickly, or it can last for a long time. In most cases, it clears up with treatment and does not cause other problems. Follow these instructions at home: Medicines  Take over-the-counter and prescription medicines only as told by your doctor.  Take your antibiotic medicine as told by your doctor. Do not stop taking the medicine even if you start to feel better. General instructions  Drink enough fluid to keep your pee clear or pale yellow.  Avoid caffeine, tea, and carbonated drinks.  Pee (urinate) often. Avoid holding in pee for long periods of time.  Pee before and after sex.  After pooping (having a bowel movement), women should wipe from front to back. Use each tissue only once.  Keep all follow-up visits as told by your doctor. This is important. Contact a doctor if:  You do not feel better after 2 days.  Your symptoms get worse.  You have a fever. Get help right away if:  You cannot take your medicine or drink fluids as told.  You have chills and shaking.  You throw up (vomit).  You have very bad pain in your side (flank) or back.  You feel very weak or you pass out (faint). This information is not intended to replace advice given to you by your health care provider. Make sure you discuss any questions you have with your health care provider. Document Released: 01/09/2005 Document Revised: 05/09/2016 Document Reviewed: 03/27/2015 Elsevier Interactive Patient Education  [employer].

## 2018-03-11 NOTE — Progress Notes (Signed)
Christy [name] , Jan 13, 1966, 52 y.o., female MRN: [id number] Patient Care Team    Relationship Specialty Notifications Start End  McGowen, Adrian Blackwater, MD PCP - General Family Medicine  06/21/15    Comment: Cristine Polio, MD  Family Medicine  06/21/15    Comment: Merged    Chief Complaint  Patient presents with  . Emesis    vomiting,flank pain, x 2 days     Subjective: Pt presents for an OV with complaints of vomiting  of 2 days duration.  Associated symptoms include flank pain (right). She completed an E-visit March 1st for simialr presentation of nausea, vomit, headache and low back pain and was prescribed keflex BID for presumed UTI. She has no urinary symptoms, but took an OTC azo test because of the back pain and it was positive. She reports she completed the abx course and symptoms resolved until 2 days ago. She again reports no urinary symptoms but positive home azo test. She endorses dark urine. She endorses h/o kidney stone 20 years ago. She denies fever, chills, diarrhea or abd pain.    Depression screen [facility] 2/9 11/10/2017 09/02/2016  Decreased Interest 0 0  Down, Depressed, Hopeless 0 0  PHQ - 2 Score 0 0    No Known Allergies Social History   Tobacco Use  . Smoking status: Current Every Day Smoker    Packs/day: 1.00    Years: 20.00    Pack years: 20.00    Types: E-cigarettes  . Smokeless tobacco: Never Used  . Tobacco comment: e-vapor cigarettes -3 mg nicotine and does 3 0z a day   Substance Use Topics  . Alcohol use: Yes    Alcohol/week: 0.0 oz    Comment: socially   Past Medical History:  Diagnosis Date  . Anxiety   . Ear infection    Recurrent; lots of scar tissue, surgeries on TM, eventually TM removed.  Marland Kitchen History of adenomatous polyp of colon 03/2016   Recall 5 yrs (Dr. Havery Moros)  . History of pleurisy 1996  . Hyperlipidemia    statin recommended 10/2017  . Left breast mass 2015   Appears benign, no bx done, getting serial mammograms via  [facility].  stable as of 08/2017.  Repeat imaging 6 mo--stable 02/2018.  six mo repeat recommended.  Marland Kitchen Positive skin test for tuberculosis age 23 approx   father had TB and died when she was 18; pt doesn't recall having CXR or getting TB tx.  . Rib fracture 12/2014   s/p fall  . Tobacco dependence   . UTI (urinary tract infection)    Past Surgical History:  Procedure Laterality Date  . COLONOSCOPY  03/18/2016   Tubular adenoma x 1.  Recall 5 yrs  . TUBAL LIGATION    . tympanectomy    . WISDOM TOOTH EXTRACTION     Family History  Problem Relation Age of Onset  . Breast cancer Mother   . Cancer Mother        uterine  . Hypertension Mother   . Stroke Mother   . Heart disease Mother   . COPD Mother   . Cancer Maternal Grandmother        uterine  . Stroke Maternal Grandmother   . Heart disease Maternal Grandmother   . Hypertension Maternal Grandmother   . Emphysema Father   . Tuberculosis Father   . Colon polyps Neg Hx   . Colon cancer Neg Hx   . Esophageal cancer  Neg Hx   . Rectal cancer Neg Hx   . Stomach cancer Neg Hx    Allergies as of 03/11/2018   No Known Allergies     Medication List        Accurate as of 03/11/18  1:38 PM. Always use your most recent med list.          acetaminophen 500 MG tablet Commonly known as:  TYLENOL Take 500 mg by mouth every 6 (six) hours as needed.   atorvastatin 20 MG tablet Commonly known as:  LIPITOR Take 1 tablet (20 mg total) by mouth daily.   clotrimazole 1 % external solution Commonly known as:  LOTRIMIN 3 drops in right ear bid x 14d   hyoscyamine 0.125 MG SL tablet Commonly known as:  LEVSIN SL 1-2 tabs po q6h prn abdominal pain   metroNIDAZOLE 0.75 % gel Commonly known as:  METROGEL Apply 1 application topically 2 (two) times daily.       All past medical history, surgical history, allergies, family history, immunizations andmedications were updated in the EMR today and reviewed under the history and  medication portions of their EMR.     ROS: Negative, with the exception of above mentioned in HPI   Objective:  BP 121/86 (BP Location: Right Arm, Patient Position: Sitting, Cuff Size: Normal)   Pulse 73   Temp 97.9 F (36.6 C)   Resp 20   Ht 5\' 4"  (1.626 m)   Wt 156 lb (70.8 kg)   LMP 12/28/2013   SpO2 97%   BMI 26.78 kg/m  Body mass index is 26.78 kg/m. Gen: Afebrile. No acute distress. Nontoxic in appearance, well developed, well nourished.  HENT: AT. [place]. MMM Eyes:Pupils Equal Round Reactive to light, Extraocular movements intact,  Conjunctiva without redness, discharge or icterus. CV: RRR  Chest: CTAB, no wheeze or crackles.  Abd: Soft. flat. Mild diffuse tenderness. ND. BS present. No rebound or guarding.  MSK: no CVA tenderness Skin: no rashes, purpura or petechiae.  Neuro: Normal gait. PERLA. EOMi. Alert. Oriented x3  No exam data present  Results for orders placed or performed in visit on 03/11/18 (from the past 24 hour(s))  POCT Urinalysis Dipstick (Automated)     Status: None   Collection Time: 03/11/18  1:36 PM  Result Value Ref Range   Color, UA yellow    Clarity, UA cloudy    Glucose, UA negative    Bilirubin, UA negative    Ketones, UA negative    Spec Grav, UA 1.020 1.010 - 1.025   Blood, UA negative    pH, UA 7.5 5.0 - 8.0   Protein, UA negative    Urobilinogen, UA 0.2 0.2 or 1.0 E.U./dL   Nitrite, UA negative    Leukocytes, UA Negative Negative    Assessment/Plan: Christy [name] is a 52 y.o. female present for OV for  Flank pain/Nausea and vomiting, intractability of vomiting not specified, unspecified vomiting type - urine does not appear infectious today. Negative for WBC, nitrates, bld and ketones. She doe snot appear dehydrated on exam. Her abd has mild diffuse tenderness and not classic for UTI. No CVA tenderness on exam. Will obtain labs to further investigate. Send urine for culture. Start cipro BID x 5 days. - POCT Urinalysis Dipstick  (Automated) - Urine Culture - CBC w/Diff - Basic Metabolic Panel (BMET) - zofran for nausea. NSAIDS for discomfort.  - F/U 1 week with PCP if not improving, if pain worsening or unable to take  abx (n/v) then should be seen in ED.       Reviewed expectations re: course of current medical issues.  Discussed self-management of symptoms.  Outlined signs and symptoms indicating need for more acute intervention.  Patient verbalized understanding and all questions were answered.  Patient received an After-Visit Summary.    Orders Placed This Encounter  Procedures  . POCT Urinalysis Dipstick (Automated)     Note is dictated utilizing voice recognition software. Although note has been proof read prior to signing, occasional typographical errors still can be missed. If any questions arise, please do not hesitate to call for verification.   electronically signed by:  Howard Pouch, DO  [place]

## 2018-03-11 NOTE — Progress Notes (Signed)
Based on what you shared with me it looks like you have a serious condition that should be evaluated in a face to face office visit.  NOTE: If you entered your credit card information for this eVisit, you will not be charged. You may see a "hold" on your card for the $30 but that hold will drop off and you will not have a charge processed.  If you are having a true medical emergency please call 911.  If you need an urgent face to face visit, [facility] has four urgent care centers for your convenience.  If you need care fast and have a high deductible or no insurance consider:   [email or web address] to reserve your spot online an avoid wait times  [facility] [address], [address] [place], [place] [address] 8 am to 8 pm Monday-Friday 10 am to 4 pm Saturday-Sunday *Across the street from [employer]  [facility]  [address]  [place], [address] 8 am to 5 pm Monday-Friday * In the [facility]   The following sites will take your  insurance:  . [facility]  [phone] Get Driving Directions Find a Provider at this Location  [address] [place], [place] [address] . 10 am to 8 pm Monday-Friday . 12 pm to 8 pm Saturday-Sunday   . [facility]  [phone] Get Driving Directions Find a Provider at this Location  [address], [address] [place], [place] [address] . 8 am to 8 pm Monday-Friday . 9 am to 6 pm Saturday . 11 am to 6 pm Sunday   .  [facility] at [facility] [place]  [phone] Get Driving Directions  [address] [place], [place] [address] . 8 am to 8 pm Monday-Friday . 8 am to 4 pm Saturday-Sunday   Your e-visit answers were reviewed by a board certified advanced clinical practitioner to complete your personal care plan.  Thank you for using e-Visits.  

## 2018-03-13 LAB — URINE CULTURE
MICRO NUMBER:: 90385497
SPECIMEN QUALITY: ADEQUATE

## 2018-05-13 ENCOUNTER — Ambulatory Visit: Payer: Self-pay

## 2018-06-09 ENCOUNTER — Telehealth: Payer: Self-pay | Admitting: Family Medicine

## 2018-06-09 NOTE — Telephone Encounter (Signed)
Copied from [place] [phone]. Topic: Appointment Scheduling - Scheduling Inquiry for Clinic >> Jun 08, 2018  5:01 PM Christy [name] wrote: Reason for CRM: pt called in and would like to know if [name] would take her on as [name] new pt?  She is currently [name] pt of [name].  This is due to location.  Pt moved

## 2018-06-09 NOTE — Telephone Encounter (Signed)
Pt called in and would like to know if Dr. Jenny Reichmann would take her on as a new pt?  She is currently a pt of Dr. Anitra Lauth. This is due to location. Pt moved.  Please advise. Thanks.

## 2018-06-09 NOTE — Telephone Encounter (Signed)
OK with me.

## 2018-06-09 NOTE — Telephone Encounter (Signed)
Ok with me, thanks.

## 2018-06-10 ENCOUNTER — Ambulatory Visit (HOSPITAL_COMMUNITY)
Admission: EM | Admit: 2018-06-10 | Discharge: 2018-06-10 | Disposition: A | Discharge status: Home / Self Care | Payer: 59 | Attending: Family Medicine | Admitting: Family Medicine

## 2018-06-10 ENCOUNTER — Encounter (HOSPITAL_COMMUNITY): Payer: Self-pay | Admitting: Emergency Medicine

## 2018-06-10 ENCOUNTER — Telehealth: Payer: 59 | Admitting: Family

## 2018-06-10 DIAGNOSIS — H538 Other visual disturbances: Secondary | ICD-10-CM

## 2018-06-10 DIAGNOSIS — R519 Headache, unspecified: Secondary | ICD-10-CM

## 2018-06-10 DIAGNOSIS — J3489 Other specified disorders of nose and nasal sinuses: Secondary | ICD-10-CM

## 2018-06-10 DIAGNOSIS — R51 Headache: Secondary | ICD-10-CM | POA: Diagnosis not present

## 2018-06-10 MED ORDER — DIPHENHYDRAMINE HCL 50 MG/ML IJ SOLN
25.0000 mg | Freq: Once | INTRAMUSCULAR | Status: AC
  Administered 2018-06-10: 25 mg via INTRAMUSCULAR

## 2018-06-10 MED ORDER — DEXAMETHASONE SODIUM PHOSPHATE 10 MG/ML IJ SOLN
INTRAMUSCULAR | Status: AC
  Filled 2018-06-10: qty 1

## 2018-06-10 MED ORDER — KETOROLAC TROMETHAMINE 60 MG/2ML IM SOLN
60.0000 mg | Freq: Once | INTRAMUSCULAR | Status: AC
Start: 2018-06-10 — End: 2018-06-10
  Administered 2018-06-10: 60 mg via INTRAMUSCULAR

## 2018-06-10 MED ORDER — KETOROLAC TROMETHAMINE 60 MG/2ML IM SOLN
INTRAMUSCULAR | Status: AC
Start: 2018-06-10 — End: 2018-06-10
  Filled 2018-06-10: qty 2

## 2018-06-10 MED ORDER — DEXAMETHASONE SODIUM PHOSPHATE 10 MG/ML IJ SOLN
10.0000 mg | Freq: Once | INTRAMUSCULAR | Status: AC
  Administered 2018-06-10: 10 mg via INTRAMUSCULAR

## 2018-06-10 MED ORDER — DIPHENHYDRAMINE HCL 50 MG/ML IJ SOLN
INTRAMUSCULAR | Status: AC
  Filled 2018-06-10: qty 1

## 2018-06-10 NOTE — ED Provider Notes (Signed)
[facility]    CSN: [id number] Arrival date & time: 06/10/18  1042     History   Chief Complaint Chief Complaint  Patient presents with  . Headache    HPI Jayona Mccaig is a 52 y.o. female.   This is a 52 y.o. Female, comes in today for headache onset 3 days ago. This is her fifth time having this same headache. Headaches are associated with blurry vision and seeing sparkles. She has no nausea, dizziness, vomiting or noise sensitivity. She does have some mild light sensitivity. Her headaches have never been evaluated in the past. Patient has been self treating at home with naproxen and Tylenol with no relief. Headache is a constant pressure. The severity of the headache varies, sometimes it is a 8/10 and sometimes it is 2/10.      Past Medical History:  Diagnosis Date  . Anxiety   . Ear infection    Recurrent; lots of scar tissue, surgeries on TM, eventually TM removed.  Marland Kitchen History of adenomatous polyp of colon 03/2016   Recall 5 yrs (Dr. Havery Moros)  . History of pleurisy 1996  . Hyperlipidemia    statin recommended 10/2017  . Left breast mass 2015   Appears benign, no bx done, getting serial mammograms via [facility].  stable as of 08/2017.  Repeat imaging 6 mo--stable 02/2018.  six mo repeat recommended.  Marland Kitchen Positive skin test for tuberculosis age 66 approx   father had TB and died when she was 79; pt doesn't recall having CXR or getting TB tx.  . Rib fracture 12/2014   s/p fall  . Tobacco dependence   . UTI (urinary tract infection)     Patient Active Problem List   Diagnosis Date Noted  . Hyperlipidemia   . Left breast lump 12/28/2013  . Breast lump on right side at 10 o'clock position 12/28/2013    Past Surgical History:  Procedure Laterality Date  . COLONOSCOPY  03/18/2016   Tubular adenoma x 1.  Recall 5 yrs  . TUBAL LIGATION    . tympanectomy    . WISDOM TOOTH EXTRACTION      OB History    Gravida  4   Para  3   Term  3   Preterm      AB  1   Living  3     SAB  1   TAB      Ectopic      Multiple      Live Births               Home Medications    Prior to Admission medications   Medication Sig Start Date End Date Taking? Authorizing Provider  acetaminophen (TYLENOL) 500 MG tablet Take 500 mg by mouth every 6 (six) hours as needed.    [provider]  atorvastatin (LIPITOR) 20 MG tablet Take 1 tablet (20 mg total) by mouth daily. Patient not taking: Reported on 06/10/2018 11/11/17   Tammi Sou, MD  ciprofloxacin (CIPRO) 500 MG tablet Take 1 tablet (500 mg total) by mouth 2 (two) times daily. Patient not taking: Reported on 06/10/2018 03/11/18   Howard Pouch A, DO  clotrimazole (LOTRIMIN) 1 % external solution 3 drops in right ear bid x 14d Patient not taking: Reported on 06/10/2018 03/01/16   Tammi Sou, MD  hyoscyamine (LEVSIN SL) 0.125 MG SL tablet 1-2 tabs po q6h prn abdominal pain Patient not taking: Reported on 06/10/2018 01/17/17  McGowen, Adrian Blackwater, MD  metroNIDAZOLE (METROGEL) 0.75 % gel Apply 1 application topically 2 (two) times daily. Patient not taking: Reported on 06/10/2018 05/23/17   Tammi Sou, MD  ondansetron (ZOFRAN) 4 MG tablet Take 1 tablet (4 mg total) by mouth every 8 (eight) hours as needed for nausea or vomiting. Patient not taking: Reported on 06/10/2018 03/11/18   Ma Hillock, DO    Family History Family History  Problem Relation Age of Onset  . Breast cancer Mother   . Cancer Mother        uterine  . Hypertension Mother   . Stroke Mother   . Heart disease Mother   . COPD Mother   . Cancer Maternal Grandmother        uterine  . Stroke Maternal Grandmother   . Heart disease Maternal Grandmother   . Hypertension Maternal Grandmother   . Emphysema Father   . Tuberculosis Father   . Colon polyps Neg Hx   . Colon cancer Neg Hx   . Esophageal cancer Neg Hx   . Rectal cancer Neg Hx   . Stomach cancer Neg Hx     Social  History Social History   Tobacco Use  . Smoking status: Current Every Day Smoker    Packs/day: 1.00    Years: 20.00    Pack years: 20.00    Types: E-cigarettes  . Smokeless tobacco: Never Used  . Tobacco comment: e-vapor cigarettes -3 mg nicotine and does 3 0z a day   Substance Use Topics  . Alcohol use: Yes    Alcohol/week: 0.0 oz    Comment: socially  . Drug use: No     Allergies   Patient has no known allergies.   Review of Systems Review of Systems  Constitutional:       See HPI     Physical Exam Triage Vital Signs ED Triage Vitals  Enc Vitals Group     BP 06/10/18 1115 122/85     Pulse Rate 06/10/18 1115 74     Resp 06/10/18 1115 16     Temp 06/10/18 1115 97.7 F (36.5 C)     Temp src --      SpO2 06/10/18 1115 100 %     Weight --      Height --      Head Circumference --      Peak Flow --      Pain Score 06/10/18 1116 4     Pain Loc --      Pain Edu? --      Excl. in [place]? --    No data found.  Updated Vital Signs BP 122/85   Pulse 74   Temp 97.7 F (36.5 C)   Resp 16   LMP 12/28/2013   SpO2 100%   Physical Exam  Constitutional: She appears well-developed and well-nourished.  Non-toxic appearance. She does not appear ill.  HENT:  Head: Normocephalic and atraumatic.  Mouth/Throat: Oropharynx is clear and moist.  Eyes: Pupils are equal, round, and reactive to light. EOM are normal. Right eye exhibits normal extraocular motion and no nystagmus. Left eye exhibits normal extraocular motion and no nystagmus.  Neck: Normal range of motion.  Cardiovascular: Normal rate, regular rhythm and normal heart sounds.  No murmur heard. Pulmonary/Chest: Effort normal and breath sounds normal. No respiratory distress. She has no wheezes.  Abdominal: Soft. Bowel sounds are normal. There is no tenderness.  Musculoskeletal: Normal range of motion. She exhibits no  tenderness.  Lymphadenopathy:    She has no cervical adenopathy.  Neurological: She is alert. She  has normal strength. She is not disoriented. She displays normal reflexes. She displays a negative Romberg sign. Coordination and gait normal. GCS eye subscore is 4. GCS verbal subscore is 5. GCS motor subscore is 6.  Skin: Skin is warm and dry.  Psychiatric: She has a normal mood and affect. Her behavior is normal.  Nursing note and vitals reviewed.   UC Treatments / Results  Labs (all labs ordered are listed, but only abnormal results are displayed) Labs Reviewed - No data to display  EKG None  Radiology No results found.  Procedures Procedures (including critical care time)  Medications Ordered in UC Medications  ketorolac (TORADOL) injection 60 mg (has no administration in time range)  dexamethasone (DECADRON) injection 10 mg (has no administration in time range)  diphenhydrAMINE (BENADRYL) injection 25 mg (has no administration in time range)    Initial Impression / Assessment and Plan / UC Course  I have reviewed the triage vital signs and the nursing notes.  Pertinent labs & imaging results that were available during my care of the patient were reviewed by me and considered in my medical decision making (see chart for details).  Final Clinical Impressions(s) / UC Diagnoses   Final diagnoses:  Acute nonintractable headache, unspecified headache type   Tension vs Migraine Headache. Physical examination unremarkable and patient is neurological intact. No red flags noted that would prompt an ER visit. Migraine cocktail given in the urgent care today. Please continue with naproxen or Tylenol at home for the headache. Please follow with your primary care provider for further evaluation. Discharge Instructions   None    ED Prescriptions    None     Controlled Substance Prescriptions [place] Controlled Substance Registry consulted? Not Applicable   Barry Dienes, NP 06/10/18 1154

## 2018-06-10 NOTE — Progress Notes (Signed)
Based on what you shared with me it looks like you have a serious condition that should be evaluated in a face to face office visit.  NOTE: If you entered your credit card information for this eVisit, you will not be charged. You may see a "hold" on your card for the $30 but that hold will drop off and you will not have a charge processed.  If you are having a true medical emergency please call 911.  If you need an urgent face to face visit, [facility] has four urgent care centers for your convenience.  If you need care fast and have a high deductible or no insurance consider:   [email or web address] to reserve your spot online an avoid wait times  [facility] [address], [address] [place], [place] [address] 8 am to 8 pm Monday-Friday 10 am to 4 pm Saturday-Sunday *Across the street from [employer]  [facility]  [address] [place], [address] 8 am to 5 pm Monday-Friday * In the [facility]   The following sites will take your  insurance:  . [facility]  [phone] Get Driving Directions Find a Provider at this Location  [address] [place], [place] [address] . 10 am to 8 pm Monday-Friday . 12 pm to 8 pm Saturday-Sunday   . [facility]  [phone] Get Driving Directions Find a Provider at this Location  [address], [address] [place], [place] [address] . 8 am to 8 pm Monday-Friday . 9 am to 6 pm Saturday . 11 am to 6 pm Sunday   . [facility] [place]  [phone] Get Driving Directions  [address] [place], [place] [address] . 8 am to 8 pm Monday-Friday . 8 am to 4 pm Saturday-Sunday   Your e-visit answers were reviewed by a board certified advanced clinical practitioner to complete your personal care plan.  Thank you for using e-Visits.  

## 2018-06-10 NOTE — ED Triage Notes (Signed)
Pt c/o headache, x3 days, states the last two days she had blurry vision and "sparklies in my eyes". Pt states she still is having the pressure behind her eyes, no blurry vision.

## 2018-06-11 NOTE — Telephone Encounter (Signed)
Patient scheduled.

## 2018-06-15 ENCOUNTER — Encounter: Payer: Self-pay | Admitting: Internal Medicine

## 2018-06-15 ENCOUNTER — Ambulatory Visit: Payer: 59 | Admitting: Internal Medicine

## 2018-06-15 VITALS — BP 98/62 | HR 82 | Temp 98.2°F | Ht 64.0 in | Wt 158.0 lb

## 2018-06-15 DIAGNOSIS — Z0000 Encounter for general adult medical examination without abnormal findings: Secondary | ICD-10-CM | POA: Diagnosis not present

## 2018-06-15 DIAGNOSIS — E785 Hyperlipidemia, unspecified: Secondary | ICD-10-CM | POA: Diagnosis not present

## 2018-06-15 DIAGNOSIS — R5383 Other fatigue: Secondary | ICD-10-CM | POA: Insufficient documentation

## 2018-06-15 DIAGNOSIS — G43809 Other migraine, not intractable, without status migrainosus: Secondary | ICD-10-CM | POA: Diagnosis not present

## 2018-06-15 DIAGNOSIS — Z114 Encounter for screening for human immunodeficiency virus [HIV]: Secondary | ICD-10-CM

## 2018-06-15 DIAGNOSIS — G43909 Migraine, unspecified, not intractable, without status migrainosus: Secondary | ICD-10-CM | POA: Insufficient documentation

## 2018-06-15 NOTE — Patient Instructions (Signed)
Please take all new medication as prescribed - the imitrex for headache  Please call if you feel you would want to try the topamax for migraines that keep recurring  Please watch your breathing at night, and call if you would want to be checked for sleep apnea with a referral to Pulmonary  Please continue all other medications as before, and refills have been done if requested.  Please have the pharmacy call with any other refills you may need.  Please keep your appointments with your specialists as you may have planned  Please return in Nov 2019, or sooner if needed, with Lab testing done 3-5 days before

## 2018-06-15 NOTE — Assessment & Plan Note (Addendum)
Recent new onset, possibly increased stress related, neuro exam ok, declines imaging such as MRI head, and trial imitrex prn  Note:  Total time for pt hx, exam, review of record with pt in the room, determination of diagnoses and plan for further eval and tx is > 40 min, with over 50% spent in coordination and counseling of patient including the differential dx, tx, further evaluation and other management of headache, fatigue and HLD

## 2018-06-15 NOTE — Progress Notes (Signed)
Subjective:    Patient ID: Christy [name], female    DOB: Dec 13, 1966, 52 y.o.   MRN: [id number]  HPI  Here to establish with new PCP, overall doing ok,  Pt denies chest pain, increasing sob or doe, wheezing, orthopnea, PND, increased LE swelling, palpitations, dizziness or syncope.  Pt denies new neurological symptoms such as facial or extremity weakness or numbness.  Pt denies polydipsia, polyuria, or low sugar episode.  Pt states overall good compliance with meds, mostly trying to follow appropriate diet, with wt overall stable,  but little exercise however. Seen at ED for migraine with cocktail that helpeds, but then last Friday 3 days ago with pressure coming back around the eyes, with vision spots, nausea but no vomiting.  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 But moved recently with increased stress. C/o fatigued today, maybe sleeping less but more sleepy during the day recently, usually take a midday nap anyway.  Past Medical History:  Diagnosis Date  . Anxiety   . Ear infection    Recurrent; lots of scar tissue, surgeries on TM, eventually TM removed.  Marland Kitchen History of adenomatous polyp of colon 03/2016   Recall 5 yrs (Dr. Havery Moros)  . History of pleurisy 1996  . Hyperlipidemia    statin recommended 10/2017  . Left breast mass 2015   Appears benign, no bx done, getting serial mammograms via [facility].  stable as of 08/2017.  Repeat imaging 6 mo--stable 02/2018.  six mo repeat recommended.  Marland Kitchen Positive skin test for tuberculosis age 12 approx   father had TB and died when she was 32; pt doesn't recall having CXR or getting TB tx.  . Rib fracture 12/2014   s/p fall  . Tobacco dependence   . UTI (urinary tract infection)    Past Surgical History:  Procedure Laterality Date  . COLONOSCOPY  03/18/2016   Tubular adenoma x 1.  Recall 5 yrs  . TUBAL LIGATION    . tympanectomy    . WISDOM TOOTH EXTRACTION      reports that she has been smoking e-cigarettes.  She  has a 20.00 pack-year smoking history. She has never used smokeless tobacco. She reports that she drinks alcohol. She reports that she does not use drugs. family history includes Breast cancer in her mother; COPD in her mother; Cancer in her maternal grandmother and mother; Emphysema in her father; Heart disease in her maternal grandmother and mother; Hypertension in her maternal grandmother and mother; Stroke in her maternal grandmother and mother; Tuberculosis in her father. No Known Allergies Current Outpatient Medications on File Prior to Visit  Medication Sig Dispense Refill  . acetaminophen (TYLENOL) 500 MG tablet Take 500 mg by mouth every 6 (six) hours as needed.    Marland Kitchen atorvastatin (LIPITOR) 20 MG tablet Take 1 tablet (20 mg total) by mouth daily. 30 tablet 2  . clotrimazole (LOTRIMIN) 1 % external solution 3 drops in right ear bid x 14d 30 mL 3  . hyoscyamine (LEVSIN SL) 0.125 MG SL tablet 1-2 tabs po q6h prn abdominal pain 30 tablet 1  . metroNIDAZOLE (METROGEL) 0.75 % gel Apply 1 application topically 2 (two) times daily. 45 g 1  . ondansetron (ZOFRAN) 4 MG tablet Take 1 tablet (4 mg total) by mouth every 8 (eight) hours as needed for nausea or vomiting. 20 tablet 0   No current facility-administered medications on file prior to visit.    Review of Systems  Constitutional: Negative  for other unusual diaphoresis or sweats HENT: Negative for ear discharge or swelling Eyes: Negative for other worsening visual disturbances Respiratory: Negative for stridor or other swelling  Gastrointestinal: Negative for worsening distension or other blood Genitourinary: Negative for retention or other urinary change Musculoskeletal: Negative for other MSK pain or swelling Skin: Negative for color change or other new lesions Neurological: Negative for worsening tremors and other numbness  Psychiatric/Behavioral: Negative for worsening agitation or other fatigue All other system neg per pt      Objective:   Physical Exam BP 98/62   Pulse 82   Temp 98.2 F (36.8 C) (Oral)   Ht 5\' 4"  (1.626 m)   Wt 158 lb (71.7 kg)   LMP 12/28/2013   SpO2 100%   BMI 27.12 kg/m  VS noted, not ill appearing Constitutional: Pt appears in NAD HENT: Head: NCAT.  Right Ear: External ear normal.  Left Ear: External ear normal.  Eyes: . Pupils are equal, round, and reactive to light. Conjunctivae and EOM are normal Nose: without d/c or deformity Neck: Neck supple. Gross normal ROM Cardiovascular: Normal rate and regular rhythm.   Pulmonary/Chest: Effort normal and breath sounds without rales or wheezing.  Abd:  Soft, NT, ND, + BS, no organomegaly Neurological: Pt is alert. At baseline orientation, motor grossly intact Skin: Skin is warm. No rashes, other new lesions, no LE edema Psychiatric: Pt behavior is normal without agitation , 1+ nervous No other exam findings  Lab Results  Component Value Date   WBC 10.1 03/11/2018   HGB 14.3 03/11/2018   HCT 41.9 03/11/2018   PLT 296.0 03/11/2018   GLUCOSE 109 (H) 03/11/2018   CHOL 279 (H) 11/10/2017   TRIG 175.0 (H) 11/10/2017   HDL 54.50 11/10/2017   LDLCALC 190 (H) 11/10/2017   ALT 12 11/10/2017   AST 13 11/10/2017   NA 140 03/11/2018   K 4.4 03/11/2018   CL 101 03/11/2018   CREATININE 0.75 03/11/2018   BUN 14 03/11/2018   CO2 29 03/11/2018   TSH 1.37 05/23/2017        Assessment & Plan:

## 2018-06-15 NOTE — Assessment & Plan Note (Signed)
Etiology unclear, ? Sleep apnea vs other, pt declines referral for sleep evaluation for now, declines labs until next cpx november

## 2018-06-15 NOTE — Assessment & Plan Note (Signed)
Cont's to decline statin for now, for lower chol diet, and f/u lab next visit

## 2018-06-19 ENCOUNTER — Encounter: Payer: Self-pay | Admitting: Internal Medicine

## 2018-06-19 MED ORDER — TOPIRAMATE 50 MG PO TABS: 50.0000 mg | ORAL_TABLET | Freq: Two times a day (BID) | ORAL | 1 refills | Status: DC

## 2018-07-15 ENCOUNTER — Telehealth: Payer: 59 | Admitting: Nurse Practitioner

## 2018-07-15 DIAGNOSIS — N3000 Acute cystitis without hematuria: Secondary | ICD-10-CM

## 2018-07-15 MED ORDER — CIPROFLOXACIN HCL 500 MG PO TABS: 500.0000 mg | ORAL_TABLET | Freq: Two times a day (BID) | ORAL | 0 refills | Status: DC

## 2018-07-15 NOTE — Progress Notes (Signed)

## 2018-09-14 ENCOUNTER — Other Ambulatory Visit: Payer: Self-pay | Admitting: Family Medicine

## 2018-09-14 ENCOUNTER — Ambulatory Visit
Admission: RE | Admit: 2018-09-14 | Discharge: 2018-09-14 | Disposition: A | Discharge status: Home / Self Care | Payer: 59 | Source: Ambulatory Visit | Attending: Family Medicine | Admitting: Family Medicine

## 2018-09-14 DIAGNOSIS — N63 Unspecified lump in unspecified breast: Secondary | ICD-10-CM

## 2018-09-14 DIAGNOSIS — R922 Inconclusive mammogram: Secondary | ICD-10-CM | POA: Diagnosis not present

## 2018-09-14 DIAGNOSIS — N6320 Unspecified lump in the left breast, unspecified quadrant: Secondary | ICD-10-CM | POA: Diagnosis not present

## 2018-09-16 ENCOUNTER — Other Ambulatory Visit (HOSPITAL_COMMUNITY): Payer: Self-pay | Admitting: Diagnostic Radiology

## 2018-09-16 ENCOUNTER — Ambulatory Visit
Admission: RE | Admit: 2018-09-16 | Discharge: 2018-09-16 | Disposition: A | Discharge status: Home / Self Care | Payer: 59 | Source: Ambulatory Visit | Attending: Family Medicine | Admitting: Family Medicine

## 2018-09-16 ENCOUNTER — Encounter: Payer: Self-pay | Admitting: Family Medicine

## 2018-09-16 DIAGNOSIS — N63 Unspecified lump in unspecified breast: Secondary | ICD-10-CM

## 2018-09-16 DIAGNOSIS — N6322 Unspecified lump in the left breast, upper inner quadrant: Secondary | ICD-10-CM | POA: Diagnosis not present

## 2018-09-16 DIAGNOSIS — N6012 Diffuse cystic mastopathy of left breast: Secondary | ICD-10-CM | POA: Diagnosis not present

## 2018-09-18 ENCOUNTER — Encounter: Payer: Self-pay | Admitting: Family Medicine

## 2018-11-11 ENCOUNTER — Other Ambulatory Visit: Payer: Self-pay | Admitting: Internal Medicine

## 2018-11-11 ENCOUNTER — Encounter: Payer: Self-pay | Admitting: Internal Medicine

## 2018-11-11 ENCOUNTER — Other Ambulatory Visit (INDEPENDENT_AMBULATORY_CARE_PROVIDER_SITE_OTHER): Payer: 59

## 2018-11-11 ENCOUNTER — Telehealth: Payer: Self-pay

## 2018-11-11 ENCOUNTER — Ambulatory Visit (INDEPENDENT_AMBULATORY_CARE_PROVIDER_SITE_OTHER): Payer: 59 | Admitting: Internal Medicine

## 2018-11-11 VITALS — BP 114/70 | HR 81 | Temp 97.9°F | Ht 64.0 in | Wt 152.0 lb

## 2018-11-11 DIAGNOSIS — Z0000 Encounter for general adult medical examination without abnormal findings: Secondary | ICD-10-CM

## 2018-11-11 DIAGNOSIS — R739 Hyperglycemia, unspecified: Secondary | ICD-10-CM | POA: Diagnosis not present

## 2018-11-11 DIAGNOSIS — E785 Hyperlipidemia, unspecified: Secondary | ICD-10-CM

## 2018-11-11 LAB — BASIC METABOLIC PANEL
BUN: 11 mg/dL (ref 6–23)
CALCIUM: 10.1 mg/dL (ref 8.4–10.5)
CHLORIDE: 103 meq/L (ref 96–112)
CO2: 28 meq/L (ref 19–32)
CREATININE: 0.78 mg/dL (ref 0.40–1.20)
GFR: 81.95 mL/min (ref >60.00)
Glucose, Bld: 104 mg/dL — ABNORMAL HIGH (ref 70–99)
Potassium: 4.6 mEq/L (ref 3.5–5.1)
Sodium: 139 mEq/L (ref 135–145)

## 2018-11-11 LAB — URINALYSIS, ROUTINE W REFLEX MICROSCOPIC
BILIRUBIN URINE: NEGATIVE
Ketones, ur: NEGATIVE
Leukocytes, UA: NEGATIVE
Nitrite: NEGATIVE
Specific Gravity, Urine: 1.015 (ref 1.000–1.030)
TOTAL PROTEIN, URINE-UPE24: NEGATIVE
URINE GLUCOSE: NEGATIVE
UROBILINOGEN UA: 0.2 (ref 0.0–1.0)
pH: 6.5 (ref 5.0–8.0)

## 2018-11-11 LAB — TSH: TSH: 1.08 u[IU]/mL (ref 0.35–4.50)

## 2018-11-11 LAB — CBC WITH DIFFERENTIAL/PLATELET
BASOS ABS: 0.1 10*3/uL (ref 0.0–0.1)
Basophils Relative: 1.3 % (ref 0.0–3.0)
EOS ABS: 0.2 10*3/uL (ref 0.0–0.7)
Eosinophils Relative: 2.4 % (ref 0.0–5.0)
HCT: 42 % (ref 36.0–46.0)
Hemoglobin: 14.3 g/dL (ref 12.0–15.0)
LYMPHS ABS: 2.4 10*3/uL (ref 0.7–4.0)
LYMPHS PCT: 35.8 % (ref 12.0–46.0)
MCHC: 34.2 g/dL (ref 30.0–36.0)
MCV: 89.6 fl (ref 78.0–100.0)
Monocytes Absolute: 0.4 10*3/uL (ref 0.1–1.0)
Monocytes Relative: 5.9 % (ref 3.0–12.0)
NEUTROS ABS: 3.6 10*3/uL (ref 1.4–7.7)
NEUTROS PCT: 54.6 % (ref 43.0–77.0)
PLATELETS: 262 10*3/uL (ref 150.0–400.0)
RBC: 4.68 Mil/uL (ref 3.87–5.11)
RDW: 13.5 % (ref 11.5–15.5)
WBC: 6.7 10*3/uL (ref 4.0–10.5)

## 2018-11-11 LAB — LIPID PANEL
Cholesterol: 254 mg/dL — ABNORMAL HIGH (ref 0–200)
HDL: 58.8 mg/dL (ref >39.00)
LDL Cholesterol: 172 mg/dL — ABNORMAL HIGH (ref 0–99)
NONHDL: 194.93
TRIGLYCERIDES: 114 mg/dL (ref 0.0–149.0)
Total CHOL/HDL Ratio: 4
VLDL: 22.8 mg/dL (ref 0.0–40.0)

## 2018-11-11 LAB — HEPATIC FUNCTION PANEL
ALK PHOS: 67 U/L (ref 39–117)
ALT: 10 U/L (ref 0–35)
AST: 13 U/L (ref 0–37)
Albumin: 4.6 g/dL (ref 3.5–5.2)
BILIRUBIN DIRECT: 0 mg/dL (ref 0.0–0.3)
Total Bilirubin: 0.5 mg/dL (ref 0.2–1.2)
Total Protein: 7.7 g/dL (ref 6.0–8.3)

## 2018-11-11 LAB — HEMOGLOBIN A1C: Hgb A1c MFr Bld: 5.5 % (ref 4.6–6.5)

## 2018-11-11 MED ORDER — ATORVASTATIN CALCIUM 40 MG PO TABS: 40.0000 mg | ORAL_TABLET | Freq: Every day | ORAL | 3 refills | Status: DC

## 2018-11-11 MED ORDER — ATORVASTATIN CALCIUM 20 MG PO TABS: 20.0000 mg | ORAL_TABLET | Freq: Every day | ORAL | 3 refills | Status: DC

## 2018-11-11 NOTE — Telephone Encounter (Signed)
-----   Message from Biagio Borg, MD sent at 11/11/2018 12:54 PM EST ----- Left message on MyChart, pt to cont same tx except  The test results show that your current treatment is OK, except the LDL cholesterol is still moderately elevated.  We should increase the lipitor to 40 mg per day.  I will send the new prescription, and you should hear from the office as well.    Anam Bobby to please inform pt, I will do rx

## 2018-11-11 NOTE — Telephone Encounter (Signed)
Pt has viewed results via MyChart  

## 2018-11-11 NOTE — Patient Instructions (Signed)

## 2018-11-11 NOTE — Progress Notes (Signed)
Subjective:    Patient ID: Christy [name], female    DOB: Sep 07, 1966, 52 y.o.   MRN: [id number]  HPI  Here for wellness and f/u;  Overall doing ok;  Pt denies Chest pain, worsening SOB, DOE, wheezing, orthopnea, PND, worsening LE edema, palpitations, dizziness or syncope.  Pt denies neurological change such as new headache, facial or extremity weakness.  Pt denies polydipsia, polyuria, or low sugar symptoms. Pt states overall good compliance with treatment and medications, good tolerability, and has been trying to follow appropriate diet.  Pt denies worsening depressive symptoms, suicidal ideation or panic. No fever, night sweats, wt loss, loss of appetite, or other constitutional symptoms.  Pt states good ability with ADL's, has low fall risk, home safety reviewed and adequate, no other significant changes in hearing or vision, and only occasionally active with exercise. Overall HA's much improved in freq and severity. No new complaints Past Medical History:  Diagnosis Date  . Anxiety   . Ear infection    Recurrent; lots of scar tissue, surgeries on TM, eventually TM removed.  Marland Kitchen History of adenomatous polyp of colon 03/2016   Recall 5 yrs (Dr. Havery Moros)  . History of pleurisy 1996  . Hyperlipidemia    statin recommended 10/2017  . Left breast mass 2015   Appears benign, no bx done, getting serial mammograms via [facility].  stable as of 08/2017.  Repeat imaging 6 mo--stable 02/2018.  Repeat 08/2018 showed changes so bx was done--BENIGN FIBROCYSTIC CHANGES!  Plan for rpt diag mammo/ultras 6 mo.  Marland Kitchen Positive skin test for tuberculosis age 89 approx   father had TB and died when she was 38; pt doesn't recall having CXR or getting TB tx.  . Rib fracture 12/2014   s/p fall  . Tobacco dependence   . UTI (urinary tract infection)    Past Surgical History:  Procedure Laterality Date  . COLONOSCOPY  03/18/2016   Tubular adenoma x 1.  Recall 5 yrs  . TUBAL LIGATION    . tympanectomy    .  WISDOM TOOTH EXTRACTION      reports that she has been smoking e-cigarettes. She has a 20.00 pack-year smoking history. She has never used smokeless tobacco. She reports that she drinks alcohol. She reports that she does not use drugs. family history includes Breast cancer in her mother; COPD in her mother; Cancer in her maternal grandmother and mother; Emphysema in her father; Heart disease in her maternal grandmother and mother; Hypertension in her maternal grandmother and mother; Stroke in her maternal grandmother and mother; Tuberculosis in her father. No Known Allergies Current Outpatient Medications on File Prior to Visit  Medication Sig Dispense Refill  . acetaminophen (TYLENOL) 500 MG tablet Take 500 mg by mouth every 6 (six) hours as needed.    . metroNIDAZOLE (METROGEL) 0.75 % gel Apply 1 application topically 2 (two) times daily. 45 g 1  . topiramate (TOPAMAX) 50 MG tablet Take 1 tablet (50 mg total) by mouth 2 (two) times daily. 180 tablet 1   No current facility-administered medications on file prior to visit.    Review of Systems Constitutional: Negative for other unusual diaphoresis, sweats, appetite or weight changes HENT: Negative for other worsening hearing loss, ear pain, facial swelling, mouth sores or neck stiffness.   Eyes: Negative for other worsening pain, redness or other visual disturbance.  Respiratory: Negative for other stridor or swelling Cardiovascular: Negative for other palpitations or other chest pain  Gastrointestinal: Negative for worsening  diarrhea or loose stools, blood in stool, distention or other pain Genitourinary: Negative for hematuria, flank pain or other change in urine volume.  Musculoskeletal: Negative for myalgias or other joint swelling.  Skin: Negative for other color change, or other wound or worsening drainage.  Neurological: Negative for other syncope or numbness. Hematological: Negative for other adenopathy or  swelling Psychiatric/Behavioral: Negative for hallucinations, other worsening agitation, SI, self-injury, or new decreased concentration All other system neg per pt    Objective:   Physical Exam BP 114/70   Pulse 81   Temp 97.9 F (36.6 C) (Oral)   Ht 5\' 4"  (1.626 m)   Wt 152 lb (68.9 kg)   LMP 12/28/2013   SpO2 97%   BMI 26.09 kg/m  VS noted,  Constitutional: Pt is oriented to person, place, and time. Appears well-developed and well-nourished, in no significant distress and comfortable Head: Normocephalic and atraumatic  Eyes: Conjunctivae and EOM are normal. Pupils are equal, round, and reactive to light Right Ear: External ear normal without discharge Left Ear: External ear normal without discharge Nose: Nose without discharge or deformity Mouth/Throat: Oropharynx is without other ulcerations and moist  Neck: Normal range of motion. Neck supple. No JVD present. No tracheal deviation present or significant neck LA or mass Cardiovascular: Normal rate, regular rhythm, normal heart sounds and intact distal pulses.   Pulmonary/Chest: WOB normal and breath sounds without rales or wheezing  Abdominal: Soft. Bowel sounds are normal. NT. No HSM  Musculoskeletal: Normal range of motion. Exhibits no edema Lymphadenopathy: Has no other cervical adenopathy.  Neurological: Pt is alert and oriented to person, place, and time. Pt has normal reflexes. No cranial nerve deficit. Motor grossly intact, Gait intact Skin: Skin is warm and dry. No rash noted or new ulcerations Psychiatric:  Has normal mood and affect. Behavior is normal without agitation No other exam findings Lab Results  Component Value Date   WBC 6.7 11/11/2018   HGB 14.3 11/11/2018   HCT 42.0 11/11/2018   PLT 262.0 11/11/2018   GLUCOSE 104 (H) 11/11/2018   CHOL 254 (H) 11/11/2018   TRIG 114.0 11/11/2018   HDL 58.80 11/11/2018   LDLCALC 172 (H) 11/11/2018   ALT 10 11/11/2018   AST 13 11/11/2018   NA 139 11/11/2018   K  4.6 11/11/2018   CL 103 11/11/2018   CREATININE 0.78 11/11/2018   BUN 11 11/11/2018   CO2 28 11/11/2018   TSH 1.08 11/11/2018   HGBA1C 5.5 11/11/2018       Assessment & Plan:

## 2018-11-12 NOTE — Assessment & Plan Note (Signed)
Mild, for a1c with labs, wt control and exercise, to f/u any worsening symptoms or concerns

## 2018-11-12 NOTE — Assessment & Plan Note (Signed)

## 2018-11-12 NOTE — Assessment & Plan Note (Signed)
Persistently elevated in recent yrs, now willing to consider statin, for LDL with labs and goal < 100

## 2018-12-04 ENCOUNTER — Other Ambulatory Visit: Payer: Self-pay | Admitting: Internal Medicine

## 2019-02-18 ENCOUNTER — Encounter: Payer: Self-pay | Admitting: Internal Medicine

## 2019-02-18 ENCOUNTER — Ambulatory Visit: Payer: 59 | Admitting: Internal Medicine

## 2019-02-18 ENCOUNTER — Ambulatory Visit (INDEPENDENT_AMBULATORY_CARE_PROVIDER_SITE_OTHER)
Admission: RE | Admit: 2019-02-18 | Discharge: 2019-02-18 | Disposition: A | Discharge status: Home / Self Care | Payer: 59 | Source: Ambulatory Visit | Attending: Internal Medicine | Admitting: Internal Medicine

## 2019-02-18 VITALS — BP 110/70 | HR 82 | Temp 98.1°F | Wt 150.0 lb

## 2019-02-18 DIAGNOSIS — M545 Low back pain, unspecified: Secondary | ICD-10-CM

## 2019-02-18 DIAGNOSIS — R5383 Other fatigue: Secondary | ICD-10-CM

## 2019-02-18 DIAGNOSIS — R739 Hyperglycemia, unspecified: Secondary | ICD-10-CM | POA: Diagnosis not present

## 2019-02-18 MED ORDER — PREDNISONE 10 MG PO TABS: ORAL_TABLET | ORAL | 0 refills | Status: DC

## 2019-02-18 MED ORDER — TRAMADOL HCL 50 MG PO TABS: 50.0000 mg | ORAL_TABLET | Freq: Four times a day (QID) | ORAL | 0 refills | Status: DC | PRN

## 2019-02-18 MED ORDER — CYCLOBENZAPRINE HCL 5 MG PO TABS: 5.0000 mg | ORAL_TABLET | Freq: Three times a day (TID) | ORAL | 1 refills | Status: DC | PRN

## 2019-02-18 NOTE — Patient Instructions (Signed)
Please take all new medication as prescribed - the pain medication, muscle relaxer as needed, and prednisone for swelling  Please continue all other medications as before, and refills have been done if requested.  Please have the pharmacy call with any other refills you may need.  Please keep your appointments with your specialists as you may have planned  Please go to the XRAY Department in the Basement (go straight as you get off the elevator) for the x-ray testing  You will be contacted by phone if any changes need to be made immediately.  Otherwise, you will receive a letter about your results with an explanation, but please check with MyChart first.  Please remember to sign up for MyChart if you have not done so, as this will be important to you in the future with finding out test results, communicating by private email, and scheduling acute appointments online when needed.

## 2019-02-18 NOTE — Assessment & Plan Note (Signed)
Mod to severe, cant r/o fx with recent fall, for LS spine xray, tramadol prn, predpac asd, and muscle relaxer prn trial,  to f/u any worsening symptoms or concerns

## 2019-02-18 NOTE — Assessment & Plan Note (Signed)
Suspect related to acute pain, exam benign,  to f/u any worsening symptoms or concerns

## 2019-02-18 NOTE — Progress Notes (Signed)
Subjective:    Patient ID: Christy [name], female    DOB: Oct 03, 1966, 53 y.o.   MRN: [id number]  HPI    Here after fall on slick surface about 1 wk ago, now with acute pain acorss the lower back, 8/10, constant, not really better with tylenol ,aleve and Ice or heat pad alternating;  Pt with bowel or bladder change, fever, wt loss,  worsening LE pain/numbness/weakness, gait change or falls  Has had chronic recurrent pain since 2009, seen per chiropracter in 2009 with film he said was c/w "l4-5 compressed" on the plain film.  No prior MRI, no surgury, no hx of osteoporosis or piror fx.   Denies urinary symptoms such as dysuria, frequency, urgency, flank pain, hematuria or n/v, fever, chills.Denies worsening reflux, abd pain, dysphagia, n/v, bowel change or blood.  No other new complaints  Does c/o ongoing fatigue, but denies signficant daytime hypersomnolence. Past Medical History:  Diagnosis Date  . Anxiety   . Ear infection    Recurrent; lots of scar tissue, surgeries on TM, eventually TM removed.  Marland Kitchen History of adenomatous polyp of colon 03/2016   Recall 5 yrs (Dr. Havery Moros)  . History of pleurisy 1996  . Hyperlipidemia    statin recommended 10/2017  . Left breast mass 2015   Appears benign, no bx done, getting serial mammograms via [facility].  stable as of 08/2017.  Repeat imaging 6 mo--stable 02/2018.  Repeat 08/2018 showed changes so bx was done--BENIGN FIBROCYSTIC CHANGES!  Plan for rpt diag mammo/ultras 6 mo.  Marland Kitchen Positive skin test for tuberculosis age 17 approx   father had TB and died when she was 14; pt doesn't recall having CXR or getting TB tx.  . Rib fracture 12/2014   s/p fall  . Tobacco dependence   . UTI (urinary tract infection)    Past Surgical History:  Procedure Laterality Date  . COLONOSCOPY  03/18/2016   Tubular adenoma x 1.  Recall 5 yrs  . TUBAL LIGATION    . tympanectomy    . WISDOM TOOTH EXTRACTION      reports that she has been smoking e-cigarettes. She  has a 20.00 pack-year smoking history. She has never used smokeless tobacco. She reports current alcohol use. She reports that she does not use drugs. family history includes Breast cancer in her mother; COPD in her mother; Cancer in her maternal grandmother and mother; Emphysema in her father; Heart disease in her maternal grandmother and mother; Hypertension in her maternal grandmother and mother; Stroke in her maternal grandmother and mother; Tuberculosis in her father. No Known Allergies Current Outpatient Medications on File Prior to Visit  Medication Sig Dispense Refill  . acetaminophen (TYLENOL) 500 MG tablet Take 500 mg by mouth every 6 (six) hours as needed.    Marland Kitchen atorvastatin (LIPITOR) 40 MG tablet Take 1 tablet (40 mg total) by mouth daily. 90 tablet 3  . topiramate (TOPAMAX) 50 MG tablet TAKE 1 TABLET BY MOUTH TWICE A DAY 60 tablet 5   No current facility-administered medications on file prior to visit.    Review of Systems  Constitutional: Negative for other unusual diaphoresis or sweats HENT: Negative for ear discharge or swelling Eyes: Negative for other worsening visual disturbances Respiratory: Negative for stridor or other swelling  Gastrointestinal: Negative for worsening distension or other blood Genitourinary: Negative for retention or other urinary change Musculoskeletal: Negative for other MSK pain or swelling Skin: Negative for color change or other new lesions Neurological: Negative  for worsening tremors and other numbness  Psychiatric/Behavioral: Negative for worsening agitation or other fatigue All other system neg per pt    Objective:   Physical Exam BP 110/70   Pulse 82   Temp 98.1 F (36.7 C) (Oral)   Wt 150 lb (68 kg)   LMP 12/28/2013   SpO2 97%   BMI 25.75 kg/m  VS noted,  Constitutional: Pt appears in NAD HENT: Head: NCAT.  Right Ear: External ear normal.  Left Ear: External ear normal.  Eyes: . Pupils are equal, round, and reactive to light.  Conjunctivae and EOM are normal Nose: without d/c or deformity Neck: Neck supple. Gross normal ROM Cardiovascular: Normal rate and regular rhythm.   Pulmonary/Chest: Effort normal and breath sounds without rales or wheezing.  Abd:  Soft, NT, ND, + BS, no organomegaly Spine: tender lowest lumbar without rash or swelling Neurological: Pt is alert. At baseline orientation, motor grossly intact Skin: Skin is warm. No rashes, other new lesions, no LE edema Psychiatric: Pt behavior is normal without agitation  No other exam findings Lab Results  Component Value Date   WBC 6.7 11/11/2018   HGB 14.3 11/11/2018   HCT 42.0 11/11/2018   PLT 262.0 11/11/2018   GLUCOSE 104 (H) 11/11/2018   CHOL 254 (H) 11/11/2018   TRIG 114.0 11/11/2018   HDL 58.80 11/11/2018   LDLCALC 172 (H) 11/11/2018   ALT 10 11/11/2018   AST 13 11/11/2018   NA 139 11/11/2018   K 4.6 11/11/2018   CL 103 11/11/2018   CREATININE 0.78 11/11/2018   BUN 11 11/11/2018   CO2 28 11/11/2018   TSH 1.08 11/11/2018   HGBA1C 5.5 11/11/2018        Assessment & Plan:

## 2019-02-18 NOTE — Assessment & Plan Note (Signed)
Lab Results  Component Value Date   HGBA1C 5.5 11/11/2018  stable overall by history and exam, recent data reviewed with pt, and pt to continue medical treatment as before,  to f/u any worsening symptoms or concerns

## 2019-02-19 ENCOUNTER — Other Ambulatory Visit: Payer: Self-pay | Admitting: Internal Medicine

## 2019-02-19 DIAGNOSIS — M545 Low back pain, unspecified: Secondary | ICD-10-CM

## 2019-02-19 DIAGNOSIS — R937 Abnormal findings on diagnostic imaging of other parts of musculoskeletal system: Secondary | ICD-10-CM

## 2019-02-22 ENCOUNTER — Telehealth: Payer: Self-pay

## 2019-02-22 NOTE — Telephone Encounter (Signed)
-----   Message from Biagio Borg, MD sent at 02/19/2019  5:16 PM EST ----- Left message on MyChart, pt to cont same tx except  The test results show that your current treatment is OK, except there may be a "spot" in the lumbar bone of uncertain etiology.  We should order a Lumbar MRI to further evaluate.  I will do the order, and you should hear from the office as well.Redmond Baseman to please inform pt, I will do order

## 2019-02-22 NOTE — Telephone Encounter (Signed)
Pt has viewed results via MyChart  

## 2019-02-23 ENCOUNTER — Telehealth: Payer: Self-pay

## 2019-02-23 DIAGNOSIS — M545 Low back pain, unspecified: Secondary | ICD-10-CM

## 2019-02-23 NOTE — Telephone Encounter (Signed)
Copied from [place] [id number]. Topic: General - Inquiry >> Feb 23, 2019 10:39 AM Ahmed Prima L wrote: Reason for CRM: Taron with [facility] called and said she received an order for her to have a MRI Lumbar Spine with Contrast. She said they can only do without contrast or with & without contrast. She said please change to whichever you prefer and just change it in epic. The patient is scheduled for this Sunday, March 15th, 2020.

## 2019-02-23 NOTE — Telephone Encounter (Signed)
I did this order as I was told the LS spine was to be done with contrast  Please clarify each time with radiology on which test they mean exactly, as this is now the third order

## 2019-02-23 NOTE — Addendum Note (Signed)
Addended by: Biagio Borg on: 02/23/2019 01:14 PM   Modules accepted: Orders

## 2019-02-24 ENCOUNTER — Other Ambulatory Visit: Payer: Self-pay | Admitting: Internal Medicine

## 2019-02-24 MED ORDER — TRAMADOL HCL 50 MG PO TABS: 50.0000 mg | ORAL_TABLET | Freq: Four times a day (QID) | ORAL | 1 refills | Status: DC | PRN

## 2019-02-24 NOTE — Telephone Encounter (Signed)
Requested medication (s) are due for refill today - if to continue  Requested medication (s) are on the active medication list -yes  Future visit scheduled -yes  Last refill: 02/18/19  Notes to clinic: Patient is requesting refill of non delegated Rx- sent for provider review  Requested Prescriptions  Pending Prescriptions Disp Refills   traMADol (ULTRAM) 50 MG tablet 30 tablet 0    Sig: Take 1 tablet (50 mg total) by mouth every 6 (six) hours as needed.     Not Delegated - Analgesics:  Opioid Agonists Failed - 02/24/2019 11:24 AM      Failed - This refill cannot be delegated      Failed - Urine Drug Screen completed in last 360 days.      Passed - Valid encounter within last 6 months    Recent Outpatient Visits          6 days ago Acute midline low back pain without sciatica   [place] John, James W, MD   3 months ago Preventative health care   [facility] -Georges Mouse, MD   8 months ago Other migraine without status migrainosus, not intractable   [place] John, James W, MD   11 months ago Flank pain   [place], [place], DO   1 year ago Hypercholesterolemia   [place], Adrian [place]water, MD      Future Appointments            In 8 months Biagio Borg, MD [place], [facility]            Requested Prescriptions  Pending Prescriptions Disp Refills   traMADol (ULTRAM) 50 MG tablet 30 tablet 0    Sig: Take 1 tablet (50 mg total) by mouth every 6 (six) hours as needed.     Not Delegated - Analgesics:  Opioid Agonists Failed - 02/24/2019 11:24 AM      Failed - This refill cannot be delegated      Failed - Urine Drug Screen completed in last 360 days.      Passed - Valid encounter within last 6 months    Recent Outpatient Visits          6 days ago Acute midline low back pain without sciatica   [place] John, James W, MD   3 months ago Preventative health care   [facility] -Georges Mouse, MD   8 months ago Other migraine without status migrainosus, not intractable   [place] John, James W, MD   11 months ago Flank pain   [place], [place], DO   1 year ago Hypercholesterolemia   [place], Adrian Blackwater, MD      Future Appointments            In 8 months Jenny Reichmann, Hunt Oris, MD [place], [facility]

## 2019-02-24 NOTE — Telephone Encounter (Signed)
Done erx 

## 2019-02-24 NOTE — Telephone Encounter (Signed)
Copied from [place] [id number]. Topic: Quick Communication - Rx Refill/Question >> Feb 24, 2019 11:08 AM Gustavus Messing wrote: Medication: traMADol (ULTRAM) 50 MG tablet   Has the patient contacted their pharmacy? No. (Agent: If no, request that the patient contact the pharmacy for the refill.) Does not have any refills and only has 1 pill left. Patient tried to fill on my chart but it was declined. She needs a new prescription    Preferred Pharmacy (with phone number or street name): [id number] - [place], [place] [phone] (Phone) [phone] (Fax)    Agent: Please be advised that RX refills may take up to 3 business days. We ask that you follow-up with your pharmacy.

## 2019-02-28 ENCOUNTER — Other Ambulatory Visit: Payer: Self-pay

## 2019-02-28 ENCOUNTER — Ambulatory Visit
Admission: RE | Admit: 2019-02-28 | Discharge: 2019-02-28 | Disposition: A | Discharge status: Home / Self Care | Payer: 59 | Source: Ambulatory Visit | Attending: Internal Medicine | Admitting: Internal Medicine

## 2019-02-28 DIAGNOSIS — M545 Low back pain, unspecified: Secondary | ICD-10-CM

## 2019-02-28 DIAGNOSIS — M5416 Radiculopathy, lumbar region: Secondary | ICD-10-CM | POA: Diagnosis not present

## 2019-02-28 MED ORDER — GADOBENATE DIMEGLUMINE 529 MG/ML IV SOLN
13.0000 mL | Freq: Once | INTRAVENOUS | Status: AC | PRN
  Administered 2019-02-28: 13 mL via INTRAVENOUS

## 2019-03-13 DIAGNOSIS — N3000 Acute cystitis without hematuria: Secondary | ICD-10-CM | POA: Diagnosis not present

## 2019-06-03 ENCOUNTER — Telehealth: Payer: 59 | Admitting: Family

## 2019-06-03 DIAGNOSIS — N390 Urinary tract infection, site not specified: Secondary | ICD-10-CM | POA: Diagnosis not present

## 2019-06-03 MED ORDER — CEPHALEXIN 500 MG PO CAPS: 500.0000 mg | ORAL_CAPSULE | Freq: Two times a day (BID) | ORAL | 0 refills | Status: DC

## 2019-06-03 NOTE — Progress Notes (Signed)
Greater than 5 minutes, yet less than 10 minutes of time have been spent researching, coordinating, and implementing care for this patient today.  Thank you for the details you included in the comment boxes. Those details are very helpful in determining the best course of treatment for you and help us to provide the best care.  We are sorry that you are not feeling well.  Here is how we plan to help!  Based on what you shared with me it looks like you most likely have a simple urinary tract infection.  A UTI (Urinary Tract Infection) is a bacterial infection of the bladder.  Most cases of urinary tract infections are simple to treat but a key part of your care is to encourage you to drink plenty of fluids and watch your symptoms carefully.  I have prescribed Keflex 500 mg twice a day for 7 days.  Your symptoms should gradually improve. Call us if the burning in your urine worsens, you develop worsening fever, back pain or pelvic pain or if your symptoms do not resolve after completing the antibiotic.  Urinary tract infections can be prevented by drinking plenty of water to keep your body hydrated.  Also be sure when you wipe, wipe from front to back and don't hold it in!  If possible, empty your bladder every 4 hours.  Your e-visit answers were reviewed by a board certified advanced clinical practitioner to complete your personal care plan.  Depending on the condition, your plan could have included both over the counter or prescription medications.  If there is a problem please reply  once you have received a response from your provider.  Your safety is important to us.  If you have drug allergies check your prescription carefully.    You can use MyChart to ask questions about today's visit, request a non-urgent call back, or ask for a work or school excuse for 24 hours related to this e-Visit. If it has been greater than 24 hours you will need to follow up with your provider, or enter a new  e-Visit to address those concerns.   You will get an e-mail in the next two days asking about your experience.  I hope that your e-visit has been valuable and will speed your recovery. Thank you for using e-visits.    

## 2019-06-22 ENCOUNTER — Other Ambulatory Visit: Payer: Self-pay | Admitting: Internal Medicine

## 2019-11-16 ENCOUNTER — Encounter: Payer: Self-pay | Admitting: Internal Medicine

## 2019-11-16 ENCOUNTER — Ambulatory Visit (INDEPENDENT_AMBULATORY_CARE_PROVIDER_SITE_OTHER): Payer: 59 | Admitting: Internal Medicine

## 2019-11-16 ENCOUNTER — Other Ambulatory Visit: Payer: Self-pay

## 2019-11-16 ENCOUNTER — Other Ambulatory Visit (INDEPENDENT_AMBULATORY_CARE_PROVIDER_SITE_OTHER): Payer: 59

## 2019-11-16 ENCOUNTER — Other Ambulatory Visit: Payer: Self-pay | Admitting: Internal Medicine

## 2019-11-16 VITALS — BP 110/62 | HR 72 | Temp 98.3°F | Wt 137.0 lb

## 2019-11-16 DIAGNOSIS — Z0001 Encounter for general adult medical examination with abnormal findings: Secondary | ICD-10-CM

## 2019-11-16 DIAGNOSIS — H60501 Unspecified acute noninfective otitis externa, right ear: Secondary | ICD-10-CM

## 2019-11-16 DIAGNOSIS — Z114 Encounter for screening for human immunodeficiency virus [HIV]: Secondary | ICD-10-CM

## 2019-11-16 DIAGNOSIS — E538 Deficiency of other specified B group vitamins: Secondary | ICD-10-CM

## 2019-11-16 DIAGNOSIS — K219 Gastro-esophageal reflux disease without esophagitis: Secondary | ICD-10-CM

## 2019-11-16 DIAGNOSIS — Z0000 Encounter for general adult medical examination without abnormal findings: Secondary | ICD-10-CM | POA: Diagnosis not present

## 2019-11-16 DIAGNOSIS — E559 Vitamin D deficiency, unspecified: Secondary | ICD-10-CM

## 2019-11-16 DIAGNOSIS — E611 Iron deficiency: Secondary | ICD-10-CM | POA: Diagnosis not present

## 2019-11-16 DIAGNOSIS — E785 Hyperlipidemia, unspecified: Secondary | ICD-10-CM

## 2019-11-16 DIAGNOSIS — R739 Hyperglycemia, unspecified: Secondary | ICD-10-CM

## 2019-11-16 DIAGNOSIS — H6091 Unspecified otitis externa, right ear: Secondary | ICD-10-CM | POA: Insufficient documentation

## 2019-11-16 LAB — CBC WITH DIFFERENTIAL/PLATELET
Basophils Absolute: 0.1 10*3/uL (ref 0.0–0.1)
Basophils Relative: 0.7 % (ref 0.0–3.0)
Eosinophils Absolute: 0.2 10*3/uL (ref 0.0–0.7)
Eosinophils Relative: 2.5 % (ref 0.0–5.0)
HCT: 44.5 % (ref 36.0–46.0)
Hemoglobin: 15.2 g/dL — ABNORMAL HIGH (ref 12.0–15.0)
Lymphocytes Relative: 38.9 % (ref 12.0–46.0)
Lymphs Abs: 3 10*3/uL (ref 0.7–4.0)
MCHC: 34 g/dL (ref 30.0–36.0)
MCV: 92.3 fl (ref 78.0–100.0)
Monocytes Absolute: 0.5 10*3/uL (ref 0.1–1.0)
Monocytes Relative: 6.3 % (ref 3.0–12.0)
Neutro Abs: 4 10*3/uL (ref 1.4–7.7)
Neutrophils Relative %: 51.6 % (ref 43.0–77.0)
Platelets: 265 10*3/uL (ref 150.0–400.0)
RBC: 4.82 Mil/uL (ref 3.87–5.11)
RDW: 13.3 % (ref 11.5–15.5)
WBC: 7.7 10*3/uL (ref 4.0–10.5)

## 2019-11-16 LAB — VITAMIN B12: Vitamin B-12: 180 pg/mL — ABNORMAL LOW (ref 211–911)

## 2019-11-16 LAB — BASIC METABOLIC PANEL
BUN: 11 mg/dL (ref 6–23)
CO2: 26 mEq/L (ref 19–32)
Calcium: 10.1 mg/dL (ref 8.4–10.5)
Chloride: 101 mEq/L (ref 96–112)
Creatinine, Ser: 0.69 mg/dL (ref 0.40–1.20)
GFR: 88.48 mL/min (ref >60.00)
Glucose, Bld: 103 mg/dL — ABNORMAL HIGH (ref 70–99)
Potassium: 4 mEq/L (ref 3.5–5.1)
Sodium: 137 mEq/L (ref 135–145)

## 2019-11-16 LAB — HEMOGLOBIN A1C: Hgb A1c MFr Bld: 5.5 % (ref 4.6–6.5)

## 2019-11-16 LAB — LIPID PANEL
Cholesterol: 267 mg/dL — ABNORMAL HIGH (ref 0–200)
HDL: 51.2 mg/dL (ref >39.00)
LDL Cholesterol: 185 mg/dL — ABNORMAL HIGH (ref 0–99)
NonHDL: 215.6
Total CHOL/HDL Ratio: 5
Triglycerides: 154 mg/dL — ABNORMAL HIGH (ref 0.0–149.0)
VLDL: 30.8 mg/dL (ref 0.0–40.0)

## 2019-11-16 LAB — URINALYSIS, ROUTINE W REFLEX MICROSCOPIC
Bilirubin Urine: NEGATIVE
Ketones, ur: NEGATIVE
Leukocytes,Ua: NEGATIVE
Nitrite: NEGATIVE
Specific Gravity, Urine: 1.01 (ref 1.000–1.030)
Total Protein, Urine: NEGATIVE
Urine Glucose: NEGATIVE
Urobilinogen, UA: 0.2 (ref 0.0–1.0)
pH: 7.5 (ref 5.0–8.0)

## 2019-11-16 LAB — TSH: TSH: 1.02 u[IU]/mL (ref 0.35–4.50)

## 2019-11-16 LAB — VITAMIN D 25 HYDROXY (VIT D DEFICIENCY, FRACTURES): VITD: 18.32 ng/mL — ABNORMAL LOW (ref 30.00–100.00)

## 2019-11-16 LAB — IBC PANEL
Iron: 152 ug/dL — ABNORMAL HIGH (ref 42–145)
Saturation Ratios: 48.5 % (ref 20.0–50.0)
Transferrin: 224 mg/dL (ref 212.0–360.0)

## 2019-11-16 LAB — HEPATIC FUNCTION PANEL
ALT: 11 U/L (ref 0–35)
AST: 13 U/L (ref 0–37)
Albumin: 4.7 g/dL (ref 3.5–5.2)
Alkaline Phosphatase: 75 U/L (ref 39–117)
Bilirubin, Direct: 0 mg/dL (ref 0.0–0.3)
Total Bilirubin: 0.7 mg/dL (ref 0.2–1.2)
Total Protein: 7.9 g/dL (ref 6.0–8.3)

## 2019-11-16 MED ORDER — LEVOFLOXACIN 500 MG PO TABS: 500.0000 mg | ORAL_TABLET | Freq: Every day | ORAL | 0 refills | Status: AC

## 2019-11-16 MED ORDER — PANTOPRAZOLE SODIUM 40 MG PO TBEC: 40.0000 mg | DELAYED_RELEASE_TABLET | Freq: Every day | ORAL | 3 refills | Status: DC

## 2019-11-16 MED ORDER — NEOMYCIN-POLYMYXIN-HC 3.5-10000-1 OT SOLN: 4.0000 [drp] | Freq: Four times a day (QID) | OTIC | 0 refills | Status: AC

## 2019-11-16 MED ORDER — ROSUVASTATIN CALCIUM 40 MG PO TABS: 40.0000 mg | ORAL_TABLET | Freq: Every day | ORAL | 3 refills | Status: DC

## 2019-11-16 NOTE — Progress Notes (Signed)
Subjective:    Patient ID: Christy [name], female    DOB: 11/22/1966, 53 y.o.   MRN: [id number]  HPI  Here for wellness and f/u;  Overall doing ok;  Pt denies Chest pain, worsening SOB, DOE, wheezing, orthopnea, PND, worsening LE edema, palpitations, dizziness or syncope.  Pt denies neurological change such as new headache, facial or extremity weakness.  Pt denies polydipsia, polyuria, or low sugar symptoms. Pt states overall good compliance with treatment and medications, good tolerability, and has been trying to follow appropriate diet.  Pt denies worsening depressive symptoms, suicidal ideation or panic. No fever, night sweats, wt loss, loss of appetite, or other constitutional symptoms.  Pt states good ability with ADL's, has low fall risk, home safety reviewed and adequate, no other significant changes in hearing or vision, and only occasionally active with exercise. Losing wt with more activity. Wt Readings from Last 3 Encounters:  11/16/19 137 lb (62.1 kg)  02/18/19 150 lb (68 kg)  11/11/18 152 lb (68.9 kg)  Also c/o mild to modworsening reflux, without abd pain, dysphagia, n/v, bowel change or blood.  Also with 2 days acute onset right ear pain, swelling, and d/c with tenderness and feeling warm, mild to mod, constant, nothing makes better or worse Past Medical History:  Diagnosis Date  . Anxiety   . Ear infection    Recurrent; lots of scar tissue, surgeries on TM, eventually TM removed.  Marland Kitchen History of adenomatous polyp of colon 03/2016   Recall 5 yrs (Dr. Havery Moros)  . History of pleurisy 1996  . Hyperlipidemia    statin recommended 10/2017  . Left breast mass 2015   Appears benign, no bx done, getting serial mammograms via [facility].  stable as of 08/2017.  Repeat imaging 6 mo--stable 02/2018.  Repeat 08/2018 showed changes so bx was done--BENIGN FIBROCYSTIC CHANGES!  Plan for rpt diag mammo/ultras 6 mo.  Marland Kitchen Positive skin test for tuberculosis age 25 approx   father had TB and  died when she was 41; pt doesn't recall having CXR or getting TB tx.  . Rib fracture 12/2014   s/p fall  . Tobacco dependence   . UTI (urinary tract infection)    Past Surgical History:  Procedure Laterality Date  . COLONOSCOPY  03/18/2016   Tubular adenoma x 1.  Recall 5 yrs  . TUBAL LIGATION    . tympanectomy    . WISDOM TOOTH EXTRACTION      reports that she has been smoking e-cigarettes. She has a 20.00 pack-year smoking history. She has never used smokeless tobacco. She reports current alcohol use. She reports that she does not use drugs. family history includes Breast cancer in her mother; COPD in her mother; Cancer in her maternal grandmother and mother; Emphysema in her father; Heart disease in her maternal grandmother and mother; Hypertension in her maternal grandmother and mother; Stroke in her maternal grandmother and mother; Tuberculosis in her father. No Known Allergies Current Outpatient Medications on File Prior to Visit  Medication Sig Dispense Refill  . acetaminophen (TYLENOL) 500 MG tablet Take 500 mg by mouth every 6 (six) hours as needed.    . topiramate (TOPAMAX) 50 MG tablet TAKE 1 TABLET BY MOUTH TWICE A DAY 60 tablet 5  . traMADol (ULTRAM) 50 MG tablet Take 1 tablet (50 mg total) by mouth every 6 (six) hours as needed. 60 tablet 1   No current facility-administered medications on file prior to visit.    Review of Systems  Constitutional: Negative for other unusual diaphoresis, sweats, appetite or weight changes HENT: Negative for other worsening hearing loss, ear pain, facial swelling, mouth sores or neck stiffness.   Eyes: Negative for other worsening pain, redness or other visual disturbance.  Respiratory: Negative for other stridor or swelling Cardiovascular: Negative for other palpitations or other chest pain  Gastrointestinal: Negative for worsening diarrhea or loose stools, blood in stool, distention or other pain Genitourinary: Negative for hematuria,  flank pain or other change in urine volume.  Musculoskeletal: Negative for myalgias or other joint swelling.  Skin: Negative for other color change, or other wound or worsening drainage.  Neurological: Negative for other syncope or numbness. Hematological: Negative for other adenopathy or swelling Psychiatric/Behavioral: Negative for hallucinations, other worsening agitation, SI, self-injury, or new decreased concentration All otherwise neg per pt     Objective:   Physical Exam BP 110/62   Pulse 72   Temp 98.3 F (36.8 C) (Oral)   Wt 137 lb (62.1 kg)   LMP 12/28/2013   SpO2 96%   BMI 23.52 kg/m  VS noted,  Constitutional: Pt is oriented to person, place, and time. Appears well-developed and well-nourished, in no significant distress and comfortable Head: Normocephalic and atraumatic  Eyes: Conjunctivae and EOM are normal. Pupils are equal, round, and reactive to light Right Ear: External ear normal with discharge, tender swelling canal Left Ear: External ear normal without discharge Nose: Nose without discharge or deformity Mouth/Throat: Oropharynx is without other ulcerations and moist  Neck: Normal range of motion. Neck supple. No JVD present. No tracheal deviation present or significant neck LA or mass Cardiovascular: Normal rate, regular rhythm, normal heart sounds and intact distal pulses.   Pulmonary/Chest: WOB normal and breath sounds without rales or wheezing  Abdominal: Soft. Bowel sounds are normal. NT. No HSM  Musculoskeletal: Normal range of motion. Exhibits no edema Lymphadenopathy: Has no other cervical adenopathy.  Neurological: Pt is alert and oriented to person, place, and time. Pt has normal reflexes. No cranial nerve deficit. Motor grossly intact, Gait intact Skin: Skin is warm and dry. No rash noted or new ulcerations Psychiatric:  Has normal mood and affect. Behavior is normal without agitation All otherwise neg per pt Lab Results  Component Value Date    WBC 7.7 11/16/2019   HGB 15.2 (H) 11/16/2019   HCT 44.5 11/16/2019   PLT 265.0 11/16/2019   GLUCOSE 103 (H) 11/16/2019   CHOL 267 (H) 11/16/2019   TRIG 154.0 (H) 11/16/2019   HDL 51.20 11/16/2019   LDLCALC 185 (H) 11/16/2019   ALT 11 11/16/2019   AST 13 11/16/2019   NA 137 11/16/2019   K 4.0 11/16/2019   CL 101 11/16/2019   CREATININE 0.69 11/16/2019   BUN 11 11/16/2019   CO2 26 11/16/2019   TSH 1.02 11/16/2019   HGBA1C 5.5 11/16/2019         Assessment & Plan:

## 2019-11-16 NOTE — Patient Instructions (Signed)
Please take all new medication as prescribed - the protonix, the ear drops and pill antibiotic  Please continue all other medications as before, and refills have been done if requested.  Please have the pharmacy call with any other refills you may need.  Please continue your efforts at being more active, low cholesterol diet, and weight control.  You are otherwise up to date with prevention measures today.  Please keep your appointments with your specialists as you may have planned  Please go to the LAB in the Basement (turn left off the elevator) for the tests to be done today  You will be contacted by phone if any changes need to be made immediately.  Otherwise, you will receive a letter about your results with an explanation, but please check with MyChart first.  Please remember to sign up for MyChart if you have not done so, as this will be important to you in the future with finding out test results, communicating by private email, and scheduling acute appointments online when needed.  Please return in 1 year for your yearly visit, or sooner if needed, with Lab testing done 3-5 days before

## 2019-11-17 LAB — HIV ANTIBODY (ROUTINE TESTING W REFLEX): HIV 1&2 Ab, 4th Generation: NONREACTIVE

## 2019-11-18 ENCOUNTER — Encounter: Payer: Self-pay | Admitting: Internal Medicine

## 2019-11-21 ENCOUNTER — Encounter: Payer: Self-pay | Admitting: Internal Medicine

## 2019-11-21 DIAGNOSIS — K219 Gastro-esophageal reflux disease without esophagitis: Secondary | ICD-10-CM | POA: Insufficient documentation

## 2019-11-21 NOTE — Assessment & Plan Note (Addendum)
Mild to mod, for antibx course,  to f/u any worsening symptoms or concerns  In addition to the time spent performing CPE, I spent an additional 25 minutes face to face,in which greater than 50% of this time was spent in counseling and coordination of care for patient's acute illness as documented, including the differential dx, treatment, further evaluation and other management of right external otitis, HLD, hyperglycemia, GERD

## 2019-11-21 NOTE — Assessment & Plan Note (Signed)
stable overall by history and exam, recent data reviewed with pt, and pt to continue medical treatment as before,  to f/u any worsening symptoms or concerns  

## 2019-11-21 NOTE — Assessment & Plan Note (Signed)
Mild, for protonix 40 qd

## 2019-11-21 NOTE — Assessment & Plan Note (Signed)

## 2019-11-21 NOTE — Assessment & Plan Note (Signed)
stable overall by history and exam, recent data reviewed with pt, and pt to continue medical treatment as before,  to f/u any worsening symptoms or concerns, for lower chol diet 

## 2019-11-22 ENCOUNTER — Other Ambulatory Visit: Payer: Self-pay

## 2019-11-22 ENCOUNTER — Ambulatory Visit (INDEPENDENT_AMBULATORY_CARE_PROVIDER_SITE_OTHER): Payer: 59

## 2019-11-22 DIAGNOSIS — E538 Deficiency of other specified B group vitamins: Secondary | ICD-10-CM | POA: Diagnosis not present

## 2019-11-22 MED ORDER — CYANOCOBALAMIN 1000 MCG/ML IJ SOLN
1000.0000 ug | Freq: Once | INTRAMUSCULAR | Status: AC
  Administered 2019-11-22: 1000 ug via INTRAMUSCULAR

## 2019-11-22 NOTE — Progress Notes (Signed)
Medical screening examination/treatment/procedure(s) were performed by non-physician practitioner and as supervising physician I was immediately available for consultation/collaboration. I agree with above. Zelphia Glover, MD   

## 2019-12-20 ENCOUNTER — Other Ambulatory Visit: Payer: Self-pay

## 2019-12-20 ENCOUNTER — Ambulatory Visit (INDEPENDENT_AMBULATORY_CARE_PROVIDER_SITE_OTHER): Payer: 59

## 2019-12-20 DIAGNOSIS — E538 Deficiency of other specified B group vitamins: Secondary | ICD-10-CM

## 2019-12-20 MED ORDER — CYANOCOBALAMIN 1000 MCG/ML IJ SOLN
1000.0000 ug | Freq: Once | INTRAMUSCULAR | Status: AC
  Administered 2019-12-20: 16:00:00 1000 ug via INTRAMUSCULAR

## 2019-12-20 NOTE — Progress Notes (Signed)
Medical screening examination/treatment/procedure(s) were performed by non-physician practitioner and as supervising physician I was immediately available for consultation/collaboration. I agree with above. Nahia Nissan, MD   

## 2019-12-30 ENCOUNTER — Other Ambulatory Visit: Payer: Self-pay | Admitting: Internal Medicine

## 2020-01-20 ENCOUNTER — Other Ambulatory Visit: Payer: Self-pay

## 2020-01-20 ENCOUNTER — Ambulatory Visit (INDEPENDENT_AMBULATORY_CARE_PROVIDER_SITE_OTHER): Payer: 59 | Admitting: *Deleted

## 2020-01-20 DIAGNOSIS — E538 Deficiency of other specified B group vitamins: Secondary | ICD-10-CM | POA: Diagnosis not present

## 2020-01-20 MED ORDER — CYANOCOBALAMIN 1000 MCG/ML IJ SOLN
1000.0000 ug | Freq: Once | INTRAMUSCULAR | Status: AC
  Administered 2020-01-20: 1000 ug via INTRAMUSCULAR

## 2020-01-20 NOTE — Progress Notes (Signed)
Medical screening examination/treatment/procedure(s) were performed by non-physician practitioner and as supervising physician I was immediately available for consultation/collaboration. I agree with above. Chase Knebel, MD   

## 2020-02-17 ENCOUNTER — Ambulatory Visit (INDEPENDENT_AMBULATORY_CARE_PROVIDER_SITE_OTHER): Payer: 59

## 2020-02-17 ENCOUNTER — Other Ambulatory Visit: Payer: Self-pay

## 2020-02-17 DIAGNOSIS — E538 Deficiency of other specified B group vitamins: Secondary | ICD-10-CM

## 2020-02-17 MED ORDER — CYANOCOBALAMIN 1000 MCG/ML IJ SOLN
1000.0000 ug | Freq: Once | INTRAMUSCULAR | Status: AC
  Administered 2020-02-17: 1000 ug via INTRAMUSCULAR

## 2020-02-17 NOTE — Progress Notes (Signed)
Medical screening examination/treatment/procedure(s) were performed by non-physician practitioner and as supervising physician I was immediately available for consultation/collaboration. I agree with above. Raigan Baria, MD   

## 2020-03-20 ENCOUNTER — Ambulatory Visit (INDEPENDENT_AMBULATORY_CARE_PROVIDER_SITE_OTHER): Payer: 59

## 2020-03-20 ENCOUNTER — Other Ambulatory Visit: Payer: Self-pay

## 2020-03-20 DIAGNOSIS — E538 Deficiency of other specified B group vitamins: Secondary | ICD-10-CM | POA: Diagnosis not present

## 2020-03-20 MED ORDER — CYANOCOBALAMIN 1000 MCG/ML IJ SOLN
1000.0000 ug | Freq: Once | INTRAMUSCULAR | Status: AC
  Administered 2020-03-20: 17:00:00 1000 ug via INTRAMUSCULAR

## 2020-03-20 NOTE — Progress Notes (Signed)
Medical screening examination/treatment/procedure(s) were performed by non-physician practitioner and as supervising physician I was immediately available for consultation/collaboration. I agree with above. Kenyetta Wimbish, MD   

## 2020-04-19 ENCOUNTER — Ambulatory Visit (INDEPENDENT_AMBULATORY_CARE_PROVIDER_SITE_OTHER): Payer: 59 | Admitting: *Deleted

## 2020-04-19 ENCOUNTER — Telehealth: Payer: Self-pay | Admitting: *Deleted

## 2020-04-19 ENCOUNTER — Other Ambulatory Visit: Payer: Self-pay

## 2020-04-19 DIAGNOSIS — E538 Deficiency of other specified B group vitamins: Secondary | ICD-10-CM

## 2020-04-19 MED ORDER — VITAMIN B-12 1000 MCG PO TABS: 1000.0000 ug | ORAL_TABLET | Freq: Every day | ORAL | 1 refills | Status: DC

## 2020-04-19 MED ORDER — CYANOCOBALAMIN 1000 MCG/ML IJ SOLN
1000.0000 ug | Freq: Once | INTRAMUSCULAR | Status: AC
  Administered 2020-04-19: 1000 ug via INTRAMUSCULAR

## 2020-04-19 NOTE — Telephone Encounter (Signed)
Per pt ok to send via mychart. Sent MD response.Marland KitchenJohny Chess

## 2020-04-19 NOTE — Telephone Encounter (Signed)
Pt states this makes her 6 injection. MD told her to have her level recheck. Wanting to know when to have done.Marland KitchenJohny Chess

## 2020-04-19 NOTE — Progress Notes (Signed)
Pls cosign for B12 inj../lmb  

## 2020-04-19 NOTE — Telephone Encounter (Signed)
Ok for change to b12 pills - [facility] (though this may also be otc)  Ok to check the b12 level at your lab if she likes now, or can be done at next visit as well

## 2020-05-03 ENCOUNTER — Other Ambulatory Visit: Payer: Self-pay

## 2020-05-03 ENCOUNTER — Other Ambulatory Visit (INDEPENDENT_AMBULATORY_CARE_PROVIDER_SITE_OTHER): Payer: 59

## 2020-05-03 DIAGNOSIS — E538 Deficiency of other specified B group vitamins: Secondary | ICD-10-CM

## 2020-05-03 DIAGNOSIS — Z0001 Encounter for general adult medical examination with abnormal findings: Secondary | ICD-10-CM

## 2020-05-03 DIAGNOSIS — R739 Hyperglycemia, unspecified: Secondary | ICD-10-CM

## 2020-05-03 LAB — VITAMIN B12: Vitamin B-12: 817 pg/mL (ref 211–911)

## 2020-07-18 ENCOUNTER — Encounter: Payer: Self-pay | Admitting: Internal Medicine

## 2020-07-19 ENCOUNTER — Other Ambulatory Visit: Payer: Self-pay

## 2020-07-19 ENCOUNTER — Ambulatory Visit: Payer: 59 | Admitting: Internal Medicine

## 2020-07-19 ENCOUNTER — Encounter: Payer: Self-pay | Admitting: Internal Medicine

## 2020-07-19 VITALS — BP 150/92 | HR 98 | Temp 98.3°F | Ht 64.0 in | Wt 140.0 lb

## 2020-07-19 DIAGNOSIS — R739 Hyperglycemia, unspecified: Secondary | ICD-10-CM | POA: Diagnosis not present

## 2020-07-19 DIAGNOSIS — R030 Elevated blood-pressure reading, without diagnosis of hypertension: Secondary | ICD-10-CM | POA: Diagnosis not present

## 2020-07-19 DIAGNOSIS — I10 Essential (primary) hypertension: Secondary | ICD-10-CM | POA: Insufficient documentation

## 2020-07-19 DIAGNOSIS — E785 Hyperlipidemia, unspecified: Secondary | ICD-10-CM | POA: Diagnosis not present

## 2020-07-19 DIAGNOSIS — F419 Anxiety disorder, unspecified: Secondary | ICD-10-CM | POA: Diagnosis not present

## 2020-07-19 DIAGNOSIS — F329 Major depressive disorder, single episode, unspecified: Secondary | ICD-10-CM

## 2020-07-19 DIAGNOSIS — F32A Depression, unspecified: Secondary | ICD-10-CM

## 2020-07-19 HISTORY — DX: Depression, unspecified: F32.A

## 2020-07-19 HISTORY — DX: Anxiety disorder, unspecified: F41.9

## 2020-07-19 MED ORDER — ALPRAZOLAM 0.5 MG PO TABS: 0.5000 mg | ORAL_TABLET | Freq: Every evening | ORAL | 1 refills | Status: DC | PRN

## 2020-07-19 MED ORDER — FLUOXETINE HCL 20 MG PO TABS
20.0000 mg | ORAL_TABLET | Freq: Every day | ORAL | 3 refills | Status: DC
Start: 2020-07-19 — End: 2020-10-02

## 2020-07-19 NOTE — Patient Instructions (Signed)
Please take all new medication as prescribed - the prozac, and xanax only if needed  Please call if you change your mind about the need for counseling referral  Please continue all other medications as before, and refills have been done if requested.  Please have the pharmacy call with any other refills you may need.  Please continue your efforts at being more active, low cholesterol diet, and weight control.  Please keep your appointments with your specialists as you may have planned

## 2020-07-19 NOTE — Assessment & Plan Note (Signed)
Likely reactive, cont to f/ at home and next visit

## 2020-07-19 NOTE — Assessment & Plan Note (Addendum)
[place] for start prozac 20 qd, xanax prn panic, and consider counseling  I spent 31 minutes in preparing to see the patient by review of recent labs, imaging and procedures, obtaining and reviewing separately obtained history, communicating with the patient and family or caregiver, ordering medications, tests or procedures, and documenting clinical information in the EHR including the differential Dx, treatment, and any further evaluation and other management of anxiety, depression, hyprelgycemia, elevated BP, hld

## 2020-07-19 NOTE — Progress Notes (Signed)
Subjective:    Patient ID: Christy [name], female    DOB: [birth year], 54 y.o.   MRN: [id number]  HPI  Here to f/u; overall doing ok,  Pt denies chest pain, increasing sob or doe, wheezing, orthopnea, PND, increased LE swelling, palpitations, dizziness or syncope.  Pt denies new neurological symptoms such as new headache, or facial or extremity weakness or numbness.  Pt denies polydipsia, polyuria, Denies worsening depressive symptoms, suicidal ideation, or panic; has ongoing anxiety, much increased recently due to family stressors.  Has taken prozac well in the past and effective.  Has not check BP at home recently Past Medical History:  Diagnosis Date  . Anxiety   . Ear infection    Recurrent; lots of scar tissue, surgeries on TM, eventually TM removed.  Marland Kitchen History of adenomatous polyp of colon 03/2016   Recall 5 yrs (Dr. Havery Moros)  . History of pleurisy 1996  . Hyperlipidemia    statin recommended 10/2017  . Left breast mass 2015   Appears benign, no bx done, getting serial mammograms via [facility].  stable as of 08/2017.  Repeat imaging 6 mo--stable 02/2018.  Repeat 08/2018 showed changes so bx was done--BENIGN FIBROCYSTIC CHANGES!  Plan for rpt diag mammo/ultras 6 mo.  Marland Kitchen Positive skin test for tuberculosis age 26 approx   father had TB and died when she was 11; pt doesn't recall having CXR or getting TB tx.  . Rib fracture 12/2014   s/p fall  . Tobacco dependence   . UTI (urinary tract infection)    Past Surgical History:  Procedure Laterality Date  . COLONOSCOPY  03/18/2016   Tubular adenoma x 1.  Recall 5 yrs  . TUBAL LIGATION    . tympanectomy    . WISDOM TOOTH EXTRACTION      reports that she has been smoking e-cigarettes. She has a 20.00 pack-year smoking history. She has never used smokeless tobacco. She reports current alcohol use. She reports that she does not use drugs. family history includes Breast cancer in her mother; COPD in her mother; Cancer in her maternal  grandmother and mother; Emphysema in her father; Heart disease in her maternal grandmother and mother; Hypertension in her maternal grandmother and mother; Stroke in her maternal grandmother and mother; Tuberculosis in her father. No Known Allergies Current Outpatient Medications on File Prior to Visit  Medication Sig Dispense Refill  . cholecalciferol (VITAMIN D3) 25 MCG (1000 UNIT) tablet Take 5,000 Units by mouth daily.    Marland Kitchen acetaminophen (TYLENOL) 500 MG tablet Take 500 mg by mouth every 6 (six) hours as needed.    . pantoprazole (PROTONIX) 40 MG tablet Take 1 tablet (40 mg total) by mouth daily. 90 tablet 3  . rosuvastatin (CRESTOR) 40 MG tablet Take 1 tablet (40 mg total) by mouth daily. 90 tablet 3  . topiramate (TOPAMAX) 50 MG tablet TAKE 1 TABLET BY MOUTH TWICE A DAY (Patient not taking: Reported on 07/19/2020) 60 tablet 5  . traMADol (ULTRAM) 50 MG tablet Take 1 tablet (50 mg total) by mouth every 6 (six) hours as needed. (Patient not taking: Reported on 07/19/2020) 60 tablet 1  . vitamin B-12 (CYANOCOBALAMIN) 1000 MCG tablet Take 1 tablet (1,000 mcg total) by mouth daily. 90 tablet 1   No current facility-administered medications on file prior to visit.   Review of Systems All otherwise neg per pt Lab Results  Component Value Date   WBC 7.7 11/16/2019   HGB 15.2 (H) 11/16/2019  HCT 44.5 11/16/2019   PLT 265.0 11/16/2019   GLUCOSE 103 (H) 11/16/2019   CHOL 267 (H) 11/16/2019   TRIG 154.0 (H) 11/16/2019   HDL 51.20 11/16/2019   LDLCALC 185 (H) 11/16/2019   ALT 11 11/16/2019   AST 13 11/16/2019   NA 137 11/16/2019   K 4.0 11/16/2019   CL 101 11/16/2019   CREATININE 0.69 11/16/2019   BUN 11 11/16/2019   CO2 26 11/16/2019   TSH 1.02 11/16/2019   HGBA1C 5.5 11/16/2019      Objective:   Physical Exam BP (!) 150/92 (BP Location: Right Arm, Patient Position: Sitting, Cuff Size: Normal)   Pulse 98   Temp 98.3 F (36.8 C) (Oral)   Ht 5\' 4"  (1.626 m)   Wt 140 lb (63.5 kg)    LMP 12/28/2013   SpO2 97%   BMI 24.03 kg/m  VS noted,  Constitutional: Pt appears in NAD HENT: Head: NCAT.  Right Ear: External ear normal.  Left Ear: External ear normal.  Eyes: . Pupils are equal, round, and reactive to light. Conjunctivae and EOM are normal Nose: without d/c or deformity Neck: Neck supple. Gross normal ROM Cardiovascular: Normal rate and regular rhythm.   Pulmonary/Chest: Effort normal and breath sounds without rales or wheezing.  Abd:  Soft, NT, ND, + BS, no organomegaly Neurological: Pt is alert. At baseline orientation, motor grossly intact Skin: Skin is warm. No rashes, other new lesions, no LE edema Psychiatric: Pt behavior is normal without agitation  but depressed anxious All otherwise neg per pt Lab Results  Component Value Date   WBC 7.7 11/16/2019   HGB 15.2 (H) 11/16/2019   HCT 44.5 11/16/2019   PLT 265.0 11/16/2019   GLUCOSE 103 (H) 11/16/2019   CHOL 267 (H) 11/16/2019   TRIG 154.0 (H) 11/16/2019   HDL 51.20 11/16/2019   LDLCALC 185 (H) 11/16/2019   ALT 11 11/16/2019   AST 13 11/16/2019   NA 137 11/16/2019   K 4.0 11/16/2019   CL 101 11/16/2019   CREATININE 0.69 11/16/2019   BUN 11 11/16/2019   CO2 26 11/16/2019   TSH 1.02 11/16/2019   HGBA1C 5.5 11/16/2019      Assessment & Plan:

## 2020-07-19 NOTE — Assessment & Plan Note (Signed)
Cont low chol diet

## 2020-07-19 NOTE — Assessment & Plan Note (Signed)
stable overall by history and exam, recent data reviewed with pt, and pt to continue medical treatment as before,  to f/u any worsening symptoms or concerns  

## 2020-08-18 ENCOUNTER — Encounter: Payer: Self-pay | Admitting: Internal Medicine

## 2020-08-18 DIAGNOSIS — F419 Anxiety disorder, unspecified: Secondary | ICD-10-CM

## 2020-09-11 ENCOUNTER — Ambulatory Visit (INDEPENDENT_AMBULATORY_CARE_PROVIDER_SITE_OTHER): Payer: 59 | Admitting: Psychology

## 2020-09-11 DIAGNOSIS — F321 Major depressive disorder, single episode, moderate: Secondary | ICD-10-CM

## 2020-09-18 ENCOUNTER — Ambulatory Visit (INDEPENDENT_AMBULATORY_CARE_PROVIDER_SITE_OTHER): Payer: 59 | Admitting: Psychology

## 2020-09-18 DIAGNOSIS — F321 Major depressive disorder, single episode, moderate: Secondary | ICD-10-CM

## 2020-09-25 ENCOUNTER — Ambulatory Visit (INDEPENDENT_AMBULATORY_CARE_PROVIDER_SITE_OTHER): Payer: 59 | Admitting: Psychology

## 2020-09-25 DIAGNOSIS — F321 Major depressive disorder, single episode, moderate: Secondary | ICD-10-CM | POA: Diagnosis not present

## 2020-10-02 ENCOUNTER — Encounter: Payer: Self-pay | Admitting: Internal Medicine

## 2020-10-02 ENCOUNTER — Ambulatory Visit (INDEPENDENT_AMBULATORY_CARE_PROVIDER_SITE_OTHER): Payer: 59 | Admitting: Psychology

## 2020-10-02 DIAGNOSIS — F321 Major depressive disorder, single episode, moderate: Secondary | ICD-10-CM | POA: Diagnosis not present

## 2020-10-02 MED ORDER — FLUOXETINE HCL 40 MG PO CAPS: 40.0000 mg | ORAL_CAPSULE | Freq: Every day | ORAL | 3 refills | Status: DC

## 2020-10-09 ENCOUNTER — Ambulatory Visit (INDEPENDENT_AMBULATORY_CARE_PROVIDER_SITE_OTHER): Payer: 59 | Admitting: Psychology

## 2020-10-09 DIAGNOSIS — F321 Major depressive disorder, single episode, moderate: Secondary | ICD-10-CM

## 2020-10-16 ENCOUNTER — Ambulatory Visit (INDEPENDENT_AMBULATORY_CARE_PROVIDER_SITE_OTHER): Payer: 59 | Admitting: Psychology

## 2020-10-16 DIAGNOSIS — F321 Major depressive disorder, single episode, moderate: Secondary | ICD-10-CM | POA: Diagnosis not present

## 2020-10-23 ENCOUNTER — Ambulatory Visit (INDEPENDENT_AMBULATORY_CARE_PROVIDER_SITE_OTHER): Payer: 59 | Admitting: Psychology

## 2020-10-23 DIAGNOSIS — F321 Major depressive disorder, single episode, moderate: Secondary | ICD-10-CM | POA: Diagnosis not present

## 2020-10-29 ENCOUNTER — Other Ambulatory Visit: Payer: Self-pay | Admitting: Internal Medicine

## 2020-10-30 ENCOUNTER — Ambulatory Visit: Payer: 59 | Admitting: Psychology

## 2020-11-06 ENCOUNTER — Ambulatory Visit: Payer: 59 | Admitting: Psychology

## 2020-11-13 ENCOUNTER — Ambulatory Visit: Payer: 59 | Admitting: Psychology

## 2020-11-16 ENCOUNTER — Other Ambulatory Visit: Payer: Self-pay

## 2020-11-16 ENCOUNTER — Encounter: Payer: Self-pay | Admitting: Internal Medicine

## 2020-11-16 ENCOUNTER — Ambulatory Visit (INDEPENDENT_AMBULATORY_CARE_PROVIDER_SITE_OTHER): Payer: 59 | Admitting: Internal Medicine

## 2020-11-16 VITALS — BP 140/90 | HR 110 | Temp 98.3°F | Ht 64.0 in | Wt 137.0 lb

## 2020-11-16 DIAGNOSIS — E559 Vitamin D deficiency, unspecified: Secondary | ICD-10-CM | POA: Diagnosis not present

## 2020-11-16 DIAGNOSIS — I10 Essential (primary) hypertension: Secondary | ICD-10-CM

## 2020-11-16 DIAGNOSIS — E7849 Other hyperlipidemia: Secondary | ICD-10-CM | POA: Diagnosis not present

## 2020-11-16 DIAGNOSIS — R739 Hyperglycemia, unspecified: Secondary | ICD-10-CM | POA: Diagnosis not present

## 2020-11-16 DIAGNOSIS — Z0000 Encounter for general adult medical examination without abnormal findings: Secondary | ICD-10-CM

## 2020-11-16 DIAGNOSIS — L989 Disorder of the skin and subcutaneous tissue, unspecified: Secondary | ICD-10-CM | POA: Diagnosis not present

## 2020-11-16 DIAGNOSIS — F32A Depression, unspecified: Secondary | ICD-10-CM

## 2020-11-16 DIAGNOSIS — E538 Deficiency of other specified B group vitamins: Secondary | ICD-10-CM | POA: Diagnosis not present

## 2020-11-16 DIAGNOSIS — F419 Anxiety disorder, unspecified: Secondary | ICD-10-CM

## 2020-11-16 DIAGNOSIS — Z0001 Encounter for general adult medical examination with abnormal findings: Secondary | ICD-10-CM

## 2020-11-16 DIAGNOSIS — Z1159 Encounter for screening for other viral diseases: Secondary | ICD-10-CM

## 2020-11-16 LAB — URINALYSIS, ROUTINE W REFLEX MICROSCOPIC
Bilirubin Urine: NEGATIVE
Ketones, ur: NEGATIVE
Leukocytes,Ua: NEGATIVE
Nitrite: NEGATIVE
Specific Gravity, Urine: 1.02 (ref 1.000–1.030)
Total Protein, Urine: NEGATIVE
Urine Glucose: NEGATIVE
Urobilinogen, UA: 0.2 (ref 0.0–1.0)
pH: 6 (ref 5.0–8.0)

## 2020-11-16 LAB — CBC WITH DIFFERENTIAL/PLATELET
Basophils Absolute: 0.1 10*3/uL (ref 0.0–0.1)
Basophils Relative: 0.8 % (ref 0.0–3.0)
Eosinophils Absolute: 0.2 10*3/uL (ref 0.0–0.7)
Eosinophils Relative: 2.3 % (ref 0.0–5.0)
HCT: 44.8 % (ref 36.0–46.0)
Hemoglobin: 15.1 g/dL — ABNORMAL HIGH (ref 12.0–15.0)
Lymphocytes Relative: 20.6 % (ref 12.0–46.0)
Lymphs Abs: 1.9 10*3/uL (ref 0.7–4.0)
MCHC: 33.7 g/dL (ref 30.0–36.0)
MCV: 95.9 fl (ref 78.0–100.0)
Monocytes Absolute: 0.5 10*3/uL (ref 0.1–1.0)
Monocytes Relative: 5.3 % (ref 3.0–12.0)
Neutro Abs: 6.7 10*3/uL (ref 1.4–7.7)
Neutrophils Relative %: 71 % (ref 43.0–77.0)
Platelets: 272 10*3/uL (ref 150.0–400.0)
RBC: 4.68 Mil/uL (ref 3.87–5.11)
RDW: 13.6 % (ref 11.5–15.5)
WBC: 9.4 10*3/uL (ref 4.0–10.5)

## 2020-11-16 LAB — BASIC METABOLIC PANEL
BUN: 8 mg/dL (ref 6–23)
CO2: 28 mEq/L (ref 19–32)
Calcium: 9.9 mg/dL (ref 8.4–10.5)
Chloride: 103 mEq/L (ref 96–112)
Creatinine, Ser: 0.75 mg/dL (ref 0.40–1.20)
GFR: 89.55 mL/min (ref >60.00)
Glucose, Bld: 91 mg/dL (ref 70–99)
Potassium: 4.5 mEq/L (ref 3.5–5.1)
Sodium: 139 mEq/L (ref 135–145)

## 2020-11-16 LAB — HEPATIC FUNCTION PANEL
ALT: 17 U/L (ref 0–35)
AST: 19 U/L (ref 0–37)
Albumin: 4.6 g/dL (ref 3.5–5.2)
Alkaline Phosphatase: 63 U/L (ref 39–117)
Bilirubin, Direct: 0.1 mg/dL (ref 0.0–0.3)
Total Bilirubin: 0.5 mg/dL (ref 0.2–1.2)
Total Protein: 7.8 g/dL (ref 6.0–8.3)

## 2020-11-16 LAB — TSH: TSH: 0.67 u[IU]/mL (ref 0.35–4.50)

## 2020-11-16 LAB — VITAMIN D 25 HYDROXY (VIT D DEFICIENCY, FRACTURES): VITD: 40.46 ng/mL (ref 30.00–100.00)

## 2020-11-16 LAB — LIPID PANEL
Cholesterol: 314 mg/dL — ABNORMAL HIGH (ref 0–200)
HDL: 57.8 mg/dL (ref >39.00)
LDL Cholesterol: 227 mg/dL — ABNORMAL HIGH (ref 0–99)
NonHDL: 256.06
Total CHOL/HDL Ratio: 5
Triglycerides: 143 mg/dL (ref 0.0–149.0)
VLDL: 28.6 mg/dL (ref 0.0–40.0)

## 2020-11-16 LAB — VITAMIN B12: Vitamin B-12: 926 pg/mL — ABNORMAL HIGH (ref 211–911)

## 2020-11-16 LAB — HEMOGLOBIN A1C: Hgb A1c MFr Bld: 5.5 % (ref 4.6–6.5)

## 2020-11-16 MED ORDER — TRIAMCINOLONE ACETONIDE 0.1 % EX CREA: 1.0000 "application " | TOPICAL_CREAM | Freq: Two times a day (BID) | CUTANEOUS | 0 refills | Status: AC

## 2020-11-16 MED ORDER — PANTOPRAZOLE SODIUM 40 MG PO TBEC: 40.0000 mg | DELAYED_RELEASE_TABLET | Freq: Every day | ORAL | 3 refills | Status: DC

## 2020-11-16 MED ORDER — VITAMIN B-12 1000 MCG PO TABS
1000.0000 ug | ORAL_TABLET | Freq: Every day | ORAL | 3 refills | Status: DC
Start: 2020-11-16 — End: 2021-09-28

## 2020-11-16 MED ORDER — LOSARTAN POTASSIUM 50 MG PO TABS: 50.0000 mg | ORAL_TABLET | Freq: Every day | ORAL | 3 refills | Status: DC

## 2020-11-16 MED ORDER — ROSUVASTATIN CALCIUM 40 MG PO TABS
40.0000 mg | ORAL_TABLET | Freq: Every day | ORAL | 3 refills | Status: DC
Start: 2020-11-16 — End: 2021-11-13

## 2020-11-16 NOTE — Assessment & Plan Note (Addendum)
New onset. for losartan 50 qd  I spent 31 minutes in preparing to see the patient by review of recent labs, imaging and procedures, obtaining and reviewing separately obtained history, communicating with the patient and family or caregiver, ordering medications, tests or procedures, and documenting clinical information in the EHR including the differential Dx, treatment, and any further evaluation and other management of htn uncontrolled, hyperglycemia, vit d and b12 def, hld, anxiety depression, skin lesion

## 2020-11-16 NOTE — Patient Instructions (Addendum)
Please take all new medication as prescribed - the losartan 50 mg per day for blood pressure, and the cream for the face area  Please remember to call for your yearly mammogram with [facility] will be contacted regarding the referral for: GYN, and dermatology  Please continue all other medications as before, and refills have been done if requested.  Please have the pharmacy call with any other refills you may need.  Please continue your efforts at being more active, low cholesterol diet, and weight control.  You are otherwise up to date with prevention measures today.  Please keep your appointments with your specialists as you may have planned  Please go to the LAB at the blood drawing area for the tests to be done  You will be contacted by phone if any changes need to be made immediately.  Otherwise, you will receive a letter about your results with an explanation, but please check with MyChart first.  Please remember to sign up for MyChart if you have not done so, as this will be important to you in the future with finding out test results, communicating by private email, and scheduling acute appointments online when needed.  Please make an Appointment to return in 6 months, or sooner if needed

## 2020-11-16 NOTE — Progress Notes (Signed)
Subjective:    Patient ID: Christy [name], female    DOB: 04-Jun-1966, 54 y.o.   MRN: [id number]  HPI  Here for wellness and f/u;  Overall doing ok;  Pt denies Chest pain, worsening SOB, DOE, wheezing, orthopnea, PND, worsening LE edema, palpitations, dizziness or syncope.  Pt denies neurological change such as new headache, facial or extremity weakness.  Pt denies polydipsia, polyuria, or low sugar symptoms. Pt states overall good compliance with treatment and medications, good tolerability, and has been trying to follow appropriate diet.  Pt denies worsening depressive symptoms, suicidal ideation or panic. No fever, night sweats, wt loss, loss of appetite, or other constitutional symptoms.  Pt states good ability with ADL's, has low fall risk, home safety reviewed and adequate, no other significant changes in hearing or vision, and only occasionally active with exercise.  Plans to call for mammogram soon, and covid booster. Mentions has been out of [place] but willing to restart. Due for GYN f/u.  Also mentions a new red scaly lesion near the left corner of the mouth for several wks without pain or fever.   Past Medical History:  Diagnosis Date  . Anxiety   . Anxiety and depression 07/19/2020  . Ear infection    Recurrent; lots of scar tissue, surgeries on TM, eventually TM removed.  Marland Kitchen History of adenomatous polyp of colon 03/2016   Recall 5 yrs (Dr. Havery Moros)  . History of pleurisy 1996  . Hyperlipidemia    statin recommended 10/2017  . Left breast mass 2015   Appears benign, no bx done, getting serial mammograms via [facility].  stable as of 08/2017.  Repeat imaging 6 mo--stable 02/2018.  Repeat 08/2018 showed changes so bx was done--BENIGN FIBROCYSTIC CHANGES!  Plan for rpt diag mammo/ultras 6 mo.  Marland Kitchen Positive skin test for tuberculosis age 16 approx   father had TB and died when she was 26; pt doesn't recall having CXR or getting TB tx.  . Rib fracture 12/2014   s/p fall  . Tobacco  dependence   . UTI (urinary tract infection)    Past Surgical History:  Procedure Laterality Date  . COLONOSCOPY  03/18/2016   Tubular adenoma x 1.  Recall 5 yrs  . TUBAL LIGATION    . tympanectomy    . WISDOM TOOTH EXTRACTION      reports that she has been smoking e-cigarettes. She has a 20.00 pack-year smoking history. She has never used smokeless tobacco. She reports current alcohol use. She reports that she does not use drugs. family history includes Breast cancer in her mother; COPD in her mother; Cancer in her maternal grandmother and mother; Emphysema in her father; Heart disease in her maternal grandmother and mother; Hypertension in her maternal grandmother and mother; Stroke in her maternal grandmother and mother; Tuberculosis in her father. No Known Allergies Current Outpatient Medications on File Prior to Visit  Medication Sig Dispense Refill  . acetaminophen (TYLENOL) 500 MG tablet Take 500 mg by mouth every 6 (six) hours as needed.    . ALPRAZolam (XANAX) 0.5 MG tablet Take 1 tablet (0.5 mg total) by mouth at bedtime as needed for anxiety. 30 tablet 1  . cholecalciferol (VITAMIN D3) 25 MCG (1000 UNIT) tablet Take 5,000 Units by mouth daily.     No current facility-administered medications on file prior to visit.   Review of Systems All otherwise neg per pt    Objective:   Physical Exam BP 140/90 (BP Location: Left Arm,  Patient Position: Sitting, Cuff Size: Large)   Pulse (!) 110   Temp 98.3 F (36.8 C) (Oral)   Ht 5\' 4"  (1.626 m)   Wt 137 lb (62.1 kg)   LMP 12/28/2013   SpO2 96%   BMI 23.52 kg/m  VS noted,  Constitutional: Pt appears in NAD HENT: Head: NCAT.  Right Ear: External ear normal.  Left Ear: External ear normal.  Eyes: . Pupils are equal, round, and reactive to light. Conjunctivae and EOM are normal Nose: without d/c or deformity Neck: Neck supple. Gross normal ROM Cardiovascular: Normal rate and regular rhythm.   Pulmonary/Chest: Effort normal  and breath sounds without rales or wheezing.  Abd:  Soft, NT, ND, + BS, no organomegaly Neurological: Pt is alert. At baseline orientation, motor grossly intact Skin: Skin is warm. Has non raised erythem scaly lesion near left corner of mouth approx 1/x cm oval,, no LE edema Psychiatric: Pt behavior is normal without agitation  Lab Results  Component Value Date   WBC 9.4 11/16/2020   HGB 15.1 (H) 11/16/2020   HCT 44.8 11/16/2020   PLT 272.0 11/16/2020   GLUCOSE 91 11/16/2020   CHOL 314 (H) 11/16/2020   TRIG 143.0 11/16/2020   HDL 57.80 11/16/2020   LDLCALC 227 (H) 11/16/2020   ALT 17 11/16/2020   AST 19 11/16/2020   NA 139 11/16/2020   K 4.5 11/16/2020   CL 103 11/16/2020   CREATININE 0.75 11/16/2020   BUN 8 11/16/2020   CO2 28 11/16/2020   TSH 0.67 11/16/2020   HGBA1C 5.5 11/16/2020      Assessment & Plan:

## 2020-11-16 NOTE — Assessment & Plan Note (Signed)
likley genetic componnent, has been out of statn for severeal wks, to restart, consider ct cardiac score

## 2020-11-17 LAB — HEPATITIS C ANTIBODY
Hepatitis C Ab: NONREACTIVE
SIGNAL TO CUT-OFF: 0.02 (ref <1.00)

## 2020-11-18 ENCOUNTER — Encounter: Payer: Self-pay | Admitting: Internal Medicine

## 2020-11-18 DIAGNOSIS — L989 Disorder of the skin and subcutaneous tissue, unspecified: Secondary | ICD-10-CM | POA: Insufficient documentation

## 2020-11-18 NOTE — Assessment & Plan Note (Signed)

## 2020-11-18 NOTE — Assessment & Plan Note (Signed)
To start vit d 2000 qd

## 2020-11-18 NOTE — Assessment & Plan Note (Signed)
?   Eczema vs other - for triam cr prn asd, and to derm if not improved

## 2020-11-18 NOTE — Assessment & Plan Note (Signed)
To start b12 1000 qd

## 2020-11-18 NOTE — Assessment & Plan Note (Signed)
stable overall by history and exam, recent data reviewed with pt, and pt to continue medical treatment as before,  to f/u any worsening symptoms or concerns  

## 2020-11-20 ENCOUNTER — Ambulatory Visit: Payer: 59 | Admitting: Psychology

## 2020-11-27 ENCOUNTER — Ambulatory Visit: Payer: 59 | Admitting: Psychology

## 2020-12-04 ENCOUNTER — Ambulatory Visit: Payer: 59 | Admitting: Psychology

## 2020-12-11 ENCOUNTER — Ambulatory Visit: Payer: 59 | Admitting: Psychology

## 2020-12-18 ENCOUNTER — Ambulatory Visit: Payer: 59 | Admitting: Psychology

## 2020-12-18 ENCOUNTER — Ambulatory Visit (INDEPENDENT_AMBULATORY_CARE_PROVIDER_SITE_OTHER): Payer: 59 | Admitting: Psychology

## 2020-12-18 DIAGNOSIS — F321 Major depressive disorder, single episode, moderate: Secondary | ICD-10-CM | POA: Diagnosis not present

## 2020-12-25 ENCOUNTER — Ambulatory Visit: Payer: 59 | Admitting: Psychology

## 2020-12-27 ENCOUNTER — Ambulatory Visit (INDEPENDENT_AMBULATORY_CARE_PROVIDER_SITE_OTHER): Payer: 59 | Admitting: Nurse Practitioner

## 2020-12-27 ENCOUNTER — Encounter: Payer: Self-pay | Admitting: Nurse Practitioner

## 2020-12-27 ENCOUNTER — Other Ambulatory Visit: Payer: Self-pay

## 2020-12-27 VITALS — BP 126/88 | HR 92 | Resp 14 | Ht 64.0 in | Wt 142.8 lb

## 2020-12-27 DIAGNOSIS — Z01419 Encounter for gynecological examination (general) (routine) without abnormal findings: Secondary | ICD-10-CM

## 2020-12-27 NOTE — Patient Instructions (Signed)
Health Maintenance After Age 56 After age 56, you are at a higher risk for certain long-term diseases and infections as well as injuries from falls. Falls are a major cause of broken bones and head injuries in people who are older than age 56. Getting regular preventive care can help to keep you healthy and well. Preventive care includes getting regular testing and making lifestyle changes as recommended by your health care provider. Talk with your health care provider about:  Which screenings and tests you should have. A screening is a test that checks for a disease when you have no symptoms.  A diet and exercise plan that is right for you. What should I know about screenings and tests to prevent falls? Screening and testing are the best ways to find a health problem early. Early diagnosis and treatment give you the best chance of managing medical conditions that are common after age 56. Certain conditions and lifestyle choices may make you more likely to have a fall. Your health care provider may recommend:  Regular vision checks. Poor vision and conditions such as cataracts can make you more likely to have a fall. If you wear glasses, make sure to get your prescription updated if your vision changes.  Medicine review. Work with your health care provider to regularly review all of the medicines you are taking, including over-the-counter medicines. Ask your health care provider about any side effects that may make you more likely to have a fall. Tell your health care provider if any medicines that you take make you feel dizzy or sleepy.  Osteoporosis screening. Osteoporosis is a condition that causes the bones to get weaker. This can make the bones weak and cause them to break more easily.  Blood pressure screening. Blood pressure changes and medicines to control blood pressure can make you feel dizzy.  Strength and balance checks. Your health care provider may recommend certain tests to check your  strength and balance while standing, walking, or changing positions.  Foot health exam. Foot pain and numbness, as well as not wearing proper footwear, can make you more likely to have a fall.  Depression screening. You may be more likely to have a fall if you have a fear of falling, feel emotionally low, or feel unable to do activities that you used to do.  Alcohol use screening. Using too much alcohol can affect your balance and may make you more likely to have a fall. What actions can I take to lower my risk of falls? General instructions  Talk with your health care provider about your risks for falling. Tell your health care provider if: ? You fall. Be sure to tell your health care provider about all falls, even ones that seem minor. ? You feel dizzy, sleepy, or off-balance.  Take over-the-counter and prescription medicines only as told by your health care provider. These include any supplements.  Eat a healthy diet and maintain a healthy weight. A healthy diet includes low-fat dairy products, low-fat (lean) meats, and fiber from whole grains, beans, and lots of fruits and vegetables. Home safety  Remove any tripping hazards, such as rugs, cords, and clutter.  Install safety equipment such as grab bars in bathrooms and safety rails on stairs.  Keep rooms and walkways well-lit. Activity  Follow a regular exercise program to stay fit. This will help you maintain your balance. Ask your health care provider what types of exercise are appropriate for you.  If you need a cane or walker,   use it as recommended by your health care provider.  Wear supportive shoes that have nonskid soles.   Lifestyle  Do not drink alcohol if your health care provider tells you not to drink.  If you drink alcohol, limit how much you have: ? 0-1 drink a day for women. ? 0-2 drinks a day for men.  Be aware of how much alcohol is in your drink. In the [place] one drink equals one typical bottle of beer (12  oz), one-half glass of wine (5 oz), or one shot of hard liquor (1 oz).  Do not use any products that contain nicotine or tobacco, such as cigarettes and e-cigarettes. If you need help quitting, ask your health care provider. Summary  Having a healthy lifestyle and getting preventive care can help to protect your health and wellness after age 56.  Screening and testing are the best way to find a health problem early and help you avoid having a fall. Early diagnosis and treatment give you the best chance for managing medical conditions that are more common for people who are older than age 56.  Falls are a major cause of broken bones and head injuries in people who are older than age 56. Take precautions to prevent a fall at home.  Work with your health care provider to learn what changes you can make to improve your health and wellness and to prevent falls. This information is not intended to replace advice given to you by your health care provider. Make sure you discuss any questions you have with your health care provider. Document Revised: 03/25/2019 Document Reviewed: 10/15/2017 Elsevier Patient Education  2021 Elsevier Inc.  

## 2020-12-27 NOTE — Progress Notes (Signed)
   Christy [name] Nov 06, 1966 [id number]   History:  55 y.o. [id number] presents as new patient to establish care. No GYN complaints. Postmenopausal -  No HRT, no bleeding. Normal pap and mammogram history. 2019 benign breast biopsy. Mother with history of breast cancer twice.   Gynecologic History Patient's last menstrual period was 12/28/2013.   Contraception/Family planning: post menopausal status  Health Maintenance Last Pap: 10/2017. Results were: normal Last mammogram: 2020 per patient. Results were: normal Last colonoscopy: 03/2016. Results were: polyps Last Dexa: Never  Past medical history, past surgical history, family history and social history were all reviewed and documented in the EPIC chart.  ROS:  A ROS was performed and pertinent positives and negatives are included.  Exam:  Vitals:   12/27/20 1146  BP: 126/88  Pulse: 92  Resp: 14  Weight: 142 lb 12.8 oz (64.8 kg)  Height: 5\' 4"  (1.626 m)   Body mass index is 24.51 kg/m.  General appearance:  Normal Thyroid:  Symmetrical, normal in size, without palpable masses or nodularity. Respiratory  Auscultation:  Clear without wheezing or rhonchi Cardiovascular  Auscultation:  Regular rate, without rubs, murmurs or gallops  Edema/varicosities:  Not grossly evident Abdominal  Soft,nontender, without masses, guarding or rebound.  Liver/spleen:  No organomegaly noted  Hernia:  None appreciated  Skin  Inspection:  Grossly normal   Breasts: Examined lying and sitting.   Right: Without masses, retractions, discharge or axillary adenopathy.   Left: Without masses, retractions, discharge or axillary adenopathy. Gentitourinary   Inguinal/mons:  Normal without inguinal adenopathy  External genitalia:  Normal  BUS/Urethra/Skene's glands:  Normal  Vagina:  Normal  Cervix:  Normal  Uterus:  Normal in size, shape and contour.  Midline and mobile  Adnexa/parametria:     Rt: Without masses or tenderness.   Lt: Without  masses or tenderness.  Anus and perineum: Normal  Digital rectal exam: Normal sphincter tone without palpated masses or tenderness  Assessment/Plan:  55 y.o. [id number] for annual exam.   Well female exam with routine gynecological exam - Education provided on SBEs, importance of preventative screenings, current guidelines, high calcium diet, regular exercise, and multivitamin daily. Labs with PCP.   Screening for cervical cancer - Normal Pap history.  Will repeat at 5-year interval per guidelines.  Screening for breast cancer - Normal mammogram history. 2019 benign breast biopsy. Overdue for mammogram but she plans to schedule this soon.  Normal breast exam today. Mother with history of breast cancer twice.   Screening for colon cancer - 2017 colonoscopy. Will repeat at GI's recommended interval.   Follow-up in 1 year for annual.    Tamela Gammon [facility], 11:53 AM 12/27/2020

## 2021-01-01 ENCOUNTER — Ambulatory Visit: Payer: 59 | Admitting: Psychology

## 2021-01-08 ENCOUNTER — Ambulatory Visit: Payer: 59 | Admitting: Psychology

## 2021-01-15 ENCOUNTER — Ambulatory Visit (INDEPENDENT_AMBULATORY_CARE_PROVIDER_SITE_OTHER): Payer: 59 | Admitting: Psychology

## 2021-01-15 ENCOUNTER — Ambulatory Visit: Payer: 59 | Admitting: Psychology

## 2021-01-15 DIAGNOSIS — F321 Major depressive disorder, single episode, moderate: Secondary | ICD-10-CM

## 2021-01-17 ENCOUNTER — Encounter: Payer: Self-pay | Admitting: Internal Medicine

## 2021-01-22 ENCOUNTER — Ambulatory Visit: Payer: 59 | Admitting: Psychology

## 2021-01-31 ENCOUNTER — Other Ambulatory Visit: Payer: Self-pay

## 2021-01-31 ENCOUNTER — Encounter: Payer: Self-pay | Admitting: Internal Medicine

## 2021-01-31 ENCOUNTER — Ambulatory Visit (INDEPENDENT_AMBULATORY_CARE_PROVIDER_SITE_OTHER): Payer: 59

## 2021-01-31 ENCOUNTER — Ambulatory Visit: Payer: 59 | Admitting: Internal Medicine

## 2021-01-31 DIAGNOSIS — I10 Essential (primary) hypertension: Secondary | ICD-10-CM | POA: Diagnosis not present

## 2021-01-31 DIAGNOSIS — N644 Mastodynia: Secondary | ICD-10-CM

## 2021-01-31 DIAGNOSIS — R079 Chest pain, unspecified: Secondary | ICD-10-CM | POA: Diagnosis not present

## 2021-01-31 NOTE — Patient Instructions (Signed)
Please continue all other medications as before, and refills have been done if requested.  Please have the pharmacy call with any other refills you may need.  Please continue your efforts at being more active, low cholesterol diet, and weight control.  Please keep your appointments with your specialists as you may have planned  Please go to the XRAY Department in the first floor for the x-ray testing  You will be contacted regarding the referral for: diagnostic mammogram and ultrasound

## 2021-01-31 NOTE — Progress Notes (Signed)
Patient ID: Christy [name], female   DOB: 15-Nov-1966, 55 y.o.   MRN: [id number]        Chief Complaint: right upper chest/breast pain       HPI:  Christy [name] is a 55 y.o. female here with c/o 2 wk onset right upper chest pain/breast tail pain not involving the axilla, mild, intermittent, pleuritic, but no HA, URI symptoms, ST, cough, CP, sob, wheezing, abd pain or dysuria.  No swelling, rash or trauma of some kind.  Pt has asked for mammogram but was referred her to have this ordered if appropriate.   Pt denies fever, wt loss, night sweats, loss of appetite, or other constitutional symptoms  Pt denies orthopnea, PND, increased LE swelling, palpitations, dizziness or syncope.  Pain not worse with moving the arm and shoulder.  Nothing else makes better or worse.  Mother has hx of breast, bladder and uterin ca, so pt is very concerned.  Pt incidentally had complete breast exam with GYN approx 2 wks ago .        Wt Readings from Last 3 Encounters:  01/31/21 140 lb (63.5 kg)  12/27/20 142 lb 12.8 oz (64.8 kg)  11/16/20 137 lb (62.1 kg)   BP Readings from Last 3 Encounters:  01/31/21 110/70  12/27/20 126/88  11/16/20 140/90         Past Medical History:  Diagnosis Date  . Anxiety   . Anxiety and depression 07/19/2020  . Ear infection    Recurrent; lots of scar tissue, surgeries on TM, eventually TM removed.  Marland Kitchen History of adenomatous polyp of colon 03/2016   Recall 5 yrs (Dr. Havery Moros)  . History of pleurisy 1996  . Hyperlipidemia    statin recommended 10/2017  . Left breast mass 2015   Appears benign, no bx done, getting serial mammograms via [facility].  stable as of 08/2017.  Repeat imaging 6 mo--stable 02/2018.  Repeat 08/2018 showed changes so bx was done--BENIGN FIBROCYSTIC CHANGES!  Plan for rpt diag mammo/ultras 6 mo.  Marland Kitchen Positive skin test for tuberculosis age 47 approx   father had TB and died when she was 70; pt doesn't recall having CXR or getting TB tx.  . Rib fracture  12/2014   s/p fall  . Tobacco dependence   . UTI (urinary tract infection)    Past Surgical History:  Procedure Laterality Date  . COLONOSCOPY  03/18/2016   Tubular adenoma x 1.  Recall 5 yrs  . TUBAL LIGATION    . tympanectomy    . WISDOM TOOTH EXTRACTION      reports that she has been smoking e-cigarettes. She has a 20.00 pack-year smoking history. She has never used smokeless tobacco. She reports current alcohol use. She reports that she does not use drugs. family history includes Bladder Cancer in her mother; Breast cancer in her mother; COPD in her mother; Cancer in her maternal grandmother and mother; Emphysema in her father; Heart disease in her maternal grandmother and mother; Hypertension in her maternal grandmother and mother; Stroke in her maternal grandmother and mother; Tuberculosis in her father. No Known Allergies Current Outpatient Medications on File Prior to Visit  Medication Sig Dispense Refill  . acetaminophen (TYLENOL) 500 MG tablet Take 500 mg by mouth every 6 (six) hours as needed.    . ALPRAZolam (XANAX) 0.5 MG tablet Take 1 tablet (0.5 mg total) by mouth at bedtime as needed for anxiety. 30 tablet 1  . cholecalciferol (VITAMIN D3) 25 MCG (1000  UNIT) tablet Take 5,000 Units by mouth daily.    Marland Kitchen losartan (COZAAR) 50 MG tablet Take 1 tablet (50 mg total) by mouth daily. 90 tablet 3  . pantoprazole (PROTONIX) 40 MG tablet Take 1 tablet (40 mg total) by mouth daily. 90 tablet 3  . rosuvastatin (CRESTOR) 40 MG tablet Take 1 tablet (40 mg total) by mouth daily. 90 tablet 3  . triamcinolone (KENALOG) 0.1 % Apply 1 application topically 2 (two) times daily. 30 g 0  . vitamin B-12 (CYANOCOBALAMIN) 1000 MCG tablet Take 1 tablet (1,000 mcg total) by mouth daily. 90 tablet 3  . FLUoxetine (PROZAC) 40 MG capsule Take 40 mg by mouth daily.     No current facility-administered medications on file prior to visit.        ROS:  All others reviewed and negative.  Objective         PE:  BP 110/70   Pulse 95   Temp 98.1 F (36.7 C) (Oral)   Ht 5\' 4"  (1.626 m)   Wt 140 lb (63.5 kg)   LMP 12/28/2013   SpO2 97%   BMI 24.03 kg/m                 Constitutional: Pt appears in NAD               HENT: Head: NCAT.                Right Ear: External ear normal.                 Left Ear: External ear normal.                Eyes: . Pupils are equal, round, and reactive to light. Conjunctivae and EOM are normal               Nose: without d/c or deformity               Neck: Neck supple. Gross normal ROM               Cardiovascular: Normal rate and regular rhythm.                 Right upper chest has non discrete diffuse soreness to touch, without rash or swelling               Pulmonary/Chest: Effort normal and breath sounds without rales or wheezing.                Abd:  Soft, NT, ND, + BS, no organomegaly               Neurological: Pt is alert. At baseline orientation, motor grossly intact               Skin: Skin is warm. No rashes, no other new lesions, LE edema - none               Psychiatric: Pt behavior is normal without agitation   Micro: none  Cardiac tracings I have personally interpreted today:  none  Pertinent Radiological findings (summarize): none   Lab Results  Component Value Date   WBC 9.4 11/16/2020   HGB 15.1 (H) 11/16/2020   HCT 44.8 11/16/2020   PLT 272.0 11/16/2020   GLUCOSE 91 11/16/2020   CHOL 314 (H) 11/16/2020   TRIG 143.0 11/16/2020   HDL 57.80 11/16/2020   LDLCALC 227 (H) 11/16/2020   ALT 17 11/16/2020   AST  19 11/16/2020   NA 139 11/16/2020   K 4.5 11/16/2020   CL 103 11/16/2020   CREATININE 0.75 11/16/2020   BUN 8 11/16/2020   CO2 28 11/16/2020   TSH 0.67 11/16/2020   HGBA1C 5.5 11/16/2020   Assessment/Plan:  Christy [name] is a 55 y.o. White or Caucasian [1] female with  has a past medical history of Anxiety, Anxiety and depression (07/19/2020), Ear infection, History of adenomatous polyp of colon (03/2016), History of  pleurisy (1996), Hyperlipidemia, Left breast mass (2015), Positive skin test for tuberculosis (age 55 approx), Rib fracture (12/2014), Tobacco dependence, and UTI (urinary tract infection).  Breast pain Ok for diag mammogram and breast u/s given her symptoms of pain  Chest pain ? MSK, but no obvious etiology, will have cxr, but o/w follow with expectant management  HTN (hypertension) BP Readings from Last 3 Encounters:  01/31/21 110/70  12/27/20 126/88  11/16/20 140/90   Stable, pt to continue medical treatment  - losartan 50   Followup: Return if symptoms worsen or fail to improve.  Cathlean Cower, MD 02/04/2021 12:45 AM [facility]

## 2021-02-04 ENCOUNTER — Encounter: Payer: Self-pay | Admitting: Internal Medicine

## 2021-02-04 NOTE — Assessment & Plan Note (Signed)
[place] for diag mammogram and breast u/s given her symptoms of pain

## 2021-02-04 NOTE — Assessment & Plan Note (Signed)
?   MSK, but no obvious etiology, will have cxr, but o/w follow with expectant management

## 2021-02-04 NOTE — Assessment & Plan Note (Signed)
BP Readings from Last 3 Encounters:  01/31/21 110/70  12/27/20 126/88  11/16/20 140/90   Stable, pt to continue medical treatment  - losartan 50

## 2021-02-12 ENCOUNTER — Ambulatory Visit: Payer: 59 | Admitting: Psychology

## 2021-03-16 ENCOUNTER — Other Ambulatory Visit: Payer: Self-pay

## 2021-03-16 ENCOUNTER — Ambulatory Visit
Admission: RE | Admit: 2021-03-16 | Discharge: 2021-03-16 | Disposition: A | Discharge status: Home / Self Care | Payer: 59 | Source: Ambulatory Visit | Attending: Internal Medicine | Admitting: Internal Medicine

## 2021-03-16 ENCOUNTER — Other Ambulatory Visit: Payer: Self-pay | Admitting: Internal Medicine

## 2021-03-16 DIAGNOSIS — N644 Mastodynia: Secondary | ICD-10-CM

## 2021-03-16 DIAGNOSIS — R079 Chest pain, unspecified: Secondary | ICD-10-CM

## 2021-03-21 ENCOUNTER — Other Ambulatory Visit: Payer: Self-pay | Admitting: Internal Medicine

## 2021-03-21 ENCOUNTER — Ambulatory Visit
Admission: RE | Admit: 2021-03-21 | Discharge: 2021-03-21 | Disposition: A | Discharge status: Home / Self Care | Payer: 59 | Source: Ambulatory Visit | Attending: Internal Medicine | Admitting: Internal Medicine

## 2021-03-21 ENCOUNTER — Other Ambulatory Visit: Payer: Self-pay

## 2021-03-21 DIAGNOSIS — R079 Chest pain, unspecified: Secondary | ICD-10-CM

## 2021-03-21 DIAGNOSIS — N644 Mastodynia: Secondary | ICD-10-CM

## 2021-03-26 LAB — AEROBIC/ANAEROBIC CULTURE W GRAM STAIN (SURGICAL/DEEP WOUND): Culture: NO GROWTH

## 2021-04-12 ENCOUNTER — Encounter: Payer: Self-pay | Admitting: Gastroenterology

## 2021-05-16 ENCOUNTER — Other Ambulatory Visit: Payer: Self-pay

## 2021-05-17 ENCOUNTER — Other Ambulatory Visit: Payer: Self-pay

## 2021-05-17 ENCOUNTER — Ambulatory Visit (INDEPENDENT_AMBULATORY_CARE_PROVIDER_SITE_OTHER): Payer: 59 | Admitting: Internal Medicine

## 2021-05-17 ENCOUNTER — Encounter: Payer: Self-pay | Admitting: Internal Medicine

## 2021-05-17 VITALS — BP 126/78 | HR 98 | Temp 98.7°F | Ht 64.0 in | Wt 140.2 lb

## 2021-05-17 DIAGNOSIS — E559 Vitamin D deficiency, unspecified: Secondary | ICD-10-CM

## 2021-05-17 DIAGNOSIS — F32A Depression, unspecified: Secondary | ICD-10-CM

## 2021-05-17 DIAGNOSIS — F419 Anxiety disorder, unspecified: Secondary | ICD-10-CM | POA: Diagnosis not present

## 2021-05-17 DIAGNOSIS — Z23 Encounter for immunization: Secondary | ICD-10-CM | POA: Diagnosis not present

## 2021-05-17 DIAGNOSIS — E538 Deficiency of other specified B group vitamins: Secondary | ICD-10-CM | POA: Diagnosis not present

## 2021-05-17 DIAGNOSIS — Z0000 Encounter for general adult medical examination without abnormal findings: Secondary | ICD-10-CM

## 2021-05-17 DIAGNOSIS — Z86010 Personal history of colonic polyps: Secondary | ICD-10-CM

## 2021-05-17 DIAGNOSIS — I10 Essential (primary) hypertension: Secondary | ICD-10-CM

## 2021-05-17 DIAGNOSIS — R739 Hyperglycemia, unspecified: Secondary | ICD-10-CM

## 2021-05-17 DIAGNOSIS — E7800 Pure hypercholesterolemia, unspecified: Secondary | ICD-10-CM

## 2021-05-17 NOTE — Progress Notes (Signed)
Patient ID: Christy [name], female   DOB: 1966-08-07, 55 y.o.   MRN: [id number]         Chief Complaint:: wellness exam       HPI:  Christy [name] is a 55 y.o. female here for wellness exam; due for shingrix and colonoscopy; o/w up to date with preventive referrals and immunizations                        Also Pt denies chest pain, increased sob or doe, wheezing, orthopnea, PND, increased LE swelling, palpitations, dizziness or syncope.   Pt denies polydipsia, polyuria, or new foal neuro s/s.   Pt denies fever, wt loss, night sweats, loss of appetite, or other constitutional symptoms    Had recent counseling and Denies worsening depressive symptoms, suicidal ideation, or panic   Wt Readings from Last 3 Encounters:  05/17/21 140 lb 3.2 oz (63.6 kg)  01/31/21 140 lb (63.5 kg)  12/27/20 142 lb 12.8 oz (64.8 kg)   BP Readings from Last 3 Encounters:  05/17/21 126/78  01/31/21 110/70  12/27/20 126/88   Immunization History  Administered Date(s) Administered  . PFIZER(Purple Top)SARS-COV-2 Vaccination 04/26/2020, 05/21/2020  . Td 12/16/2002  . Tdap 01/19/2016  . Zoster Recombinat (Shingrix) 11/10/2017, 05/17/2021   Health Maintenance Due  Topic Date Due  . Pneumococcal Vaccine 49-69 Years old (1 of 2 - PPSV23) Never done  . COLONOSCOPY (Pts 45-95yrs Insurance coverage will need to be confirmed)  03/18/2021      Past Medical History:  Diagnosis Date  . Anxiety   . Anxiety and depression 07/19/2020  . Ear infection    Recurrent; lots of scar tissue, surgeries on TM, eventually TM removed.  Marland Kitchen History of adenomatous polyp of colon 03/2016   Recall 5 yrs (Dr. Havery Moros)  . History of pleurisy 1996  . Hyperlipidemia    statin recommended 10/2017  . Left breast mass 2015   Appears benign, no bx done, getting serial mammograms via [facility].  stable as of 08/2017.  Repeat imaging 6 mo--stable 02/2018.  Repeat 08/2018 showed changes so bx was done--BENIGN FIBROCYSTIC CHANGES!  Plan  for rpt diag mammo/ultras 6 mo.  Marland Kitchen Positive skin test for tuberculosis age 19 approx   father had TB and died when she was 25; pt doesn't recall having CXR or getting TB tx.  . Rib fracture 12/2014   s/p fall  . Tobacco dependence   . UTI (urinary tract infection)    Past Surgical History:  Procedure Laterality Date  . COLONOSCOPY  03/18/2016   Tubular adenoma x 1.  Recall 5 yrs  . TUBAL LIGATION    . tympanectomy    . WISDOM TOOTH EXTRACTION      reports that she has been smoking e-cigarettes. She has a 20.00 pack-year smoking history. She has never used smokeless tobacco. She reports current alcohol use. She reports that she does not use drugs. family history includes Bladder Cancer in her mother; Breast cancer in her mother; COPD in her mother; Cancer in her maternal grandmother and mother; Emphysema in her father; Heart disease in her maternal grandmother and mother; Hypertension in her maternal grandmother and mother; Stroke in her maternal grandmother and mother; Tuberculosis in her father. No Known Allergies Current Outpatient Medications on File Prior to Visit  Medication Sig Dispense Refill  . acetaminophen (TYLENOL) 500 MG tablet Take 500 mg by mouth every 6 (six) hours as needed.    Marland Kitchen  ALPRAZolam (XANAX) 0.5 MG tablet Take 1 tablet (0.5 mg total) by mouth at bedtime as needed for anxiety. 30 tablet 1  . cholecalciferol (VITAMIN D3) 25 MCG (1000 UNIT) tablet Take 5,000 Units by mouth daily.    Marland Kitchen FLUoxetine (PROZAC) 40 MG capsule Take 40 mg by mouth daily.    Marland Kitchen losartan (COZAAR) 50 MG tablet Take 1 tablet (50 mg total) by mouth daily. 90 tablet 3  . pantoprazole (PROTONIX) 40 MG tablet Take 1 tablet (40 mg total) by mouth daily. 90 tablet 3  . rosuvastatin (CRESTOR) 40 MG tablet Take 1 tablet (40 mg total) by mouth daily. 90 tablet 3  . triamcinolone (KENALOG) 0.1 % Apply 1 application topically 2 (two) times daily. 30 g 0  . vitamin B-12 (CYANOCOBALAMIN) 1000 MCG tablet Take 1  tablet (1,000 mcg total) by mouth daily. 90 tablet 3   No current facility-administered medications on file prior to visit.        ROS:  All others reviewed and negative.  Objective        PE:  BP 126/78 (BP Location: Left Arm, Patient Position: Sitting, Cuff Size: Normal)   Pulse 98   Temp 98.7 F (37.1 C) (Oral)   Ht 5\' 4"  (1.626 m)   Wt 140 lb 3.2 oz (63.6 kg)   LMP 12/28/2013   SpO2 96%   BMI 24.07 kg/m                 Constitutional: Pt appears in NAD               HENT: Head: NCAT.                Right Ear: External ear normal.                 Left Ear: External ear normal.                Eyes: . Pupils are equal, round, and reactive to light. Conjunctivae and EOM are normal               Nose: without d/c or deformity               Neck: Neck supple. Gross normal ROM               Cardiovascular: Normal rate and regular rhythm.                 Pulmonary/Chest: Effort normal and breath sounds without rales or wheezing.                Abd:  Soft, NT, ND, + BS, no organomegaly               Neurological: Pt is alert. At baseline orientation, motor grossly intact               Skin: Skin is warm. No rashes, no other new lesions, LE edema - none               Psychiatric: Pt behavior is normal without agitation   Micro: none  Cardiac tracings I have personally interpreted today:  none  Pertinent Radiological findings (summarize): none   Lab Results  Component Value Date   WBC 9.4 11/16/2020   HGB 15.1 (H) 11/16/2020   HCT 44.8 11/16/2020   PLT 272.0 11/16/2020   GLUCOSE 91 11/16/2020   CHOL 314 (H) 11/16/2020   TRIG 143.0 11/16/2020   HDL 57.80 11/16/2020  LDLCALC 227 (H) 11/16/2020   ALT 17 11/16/2020   AST 19 11/16/2020   NA 139 11/16/2020   K 4.5 11/16/2020   CL 103 11/16/2020   CREATININE 0.75 11/16/2020   BUN 8 11/16/2020   CO2 28 11/16/2020   TSH 0.67 11/16/2020   HGBA1C 5.5 11/16/2020   Assessment/Plan:  Christy [name] is a 55 y.o. White or  Caucasian [1] female with  has a past medical history of Anxiety, Anxiety and depression (07/19/2020), Ear infection, History of adenomatous polyp of colon (03/2016), History of pleurisy (1996), Hyperlipidemia, Left breast mass (2015), Positive skin test for tuberculosis (age 55 approx), Rib fracture (12/2014), Tobacco dependence, and UTI (urinary tract infection).  Vitamin D deficiency Last vitamin D Lab Results  Component Value Date   VD25OH 40.46 11/16/2020   Stable, cont oral replacement   Vitamin B12 deficiency Lab Results  Component Value Date   VITAMINB12 926 (H) 11/16/2020   Stable, cont oral replacement - b12 1000 mcg qd   Preventative health care Age and sex appropriate education and counseling updated with regular exercise and diet Referrals for preventative services - for colonoscopy Immunizations addressed - for shingrix Smoking counseling  - counseled to quit, pt not ready Evidence for depression or other mood disorder - none significant Most recent labs reviewed. I have personally reviewed and have noted: 1) the patient's medical and social history 2) The patient's current medications and supplements 3) The patient's height, weight, and BMI have been recorded in the chart   Hyperlipidemia Lab Results  Component Value Date   LDLCALC 227 (H) 11/16/2020   Severe uncontrolled,, pt to continue restart current statin crestor   Hyperglycemia Lab Results  Component Value Date   HGBA1C 5.5 11/16/2020   Stable, pt to continue current medical treatment  - diet   Anxiety and depression Improved, stable post counseing  HTN (hypertension) BP Readings from Last 3 Encounters:  05/17/21 126/78  01/31/21 110/70  12/27/20 126/88   Stable, pt to continue medical treatment losartan   Followup: Return in about 1 year (around 05/17/2022).  Cathlean Cower, MD 05/17/2021 8:42 PM [facility]

## 2021-05-17 NOTE — Assessment & Plan Note (Signed)
Lab Results  Component Value Date   LDLCALC 227 (H) 11/16/2020   Severe uncontrolled,, pt to continue restart current statin crestor

## 2021-05-17 NOTE — Assessment & Plan Note (Signed)
Lab Results  Component Value Date   HGBA1C 5.5 11/16/2020   Stable, pt to continue current medical treatment  - diet

## 2021-05-17 NOTE — Assessment & Plan Note (Signed)
Last vitamin D Lab Results  Component Value Date   VD25OH 40.46 11/16/2020   Stable, cont oral replacement  

## 2021-05-17 NOTE — Assessment & Plan Note (Signed)
BP Readings from Last 3 Encounters:  05/17/21 126/78  01/31/21 110/70  12/27/20 126/88   Stable, pt to continue medical treatment losartan

## 2021-05-17 NOTE — Assessment & Plan Note (Signed)
Improved, stable post counseing

## 2021-05-17 NOTE — Patient Instructions (Signed)
You had the shingrix shot #2 today  Please continue all other medications as before, and refills have been done if requested.  Please have the pharmacy call with any other refills you may need.  Please continue your efforts at being more active, low cholesterol diet, and weight control.  You are otherwise up to date with prevention measures today.  Please keep your appointments with your specialists as you may have planned  You will be contacted regarding the referral for: colonoscopy  Please make an Appointment to return for your 1 year visit, or sooner if needed

## 2021-05-17 NOTE — Assessment & Plan Note (Signed)
Lab Results  Component Value Date   VITAMINB12 926 (H) 11/16/2020   Stable, cont oral replacement - b12 1000 mcg qd  

## 2021-05-17 NOTE — Assessment & Plan Note (Signed)
Age and sex appropriate education and counseling updated with regular exercise and diet Referrals for preventative services - for colonoscopy Immunizations addressed - for shingrix Smoking counseling  - counseled to quit, pt not ready Evidence for depression or other mood disorder - none significant Most recent labs reviewed. I have personally reviewed and have noted: 1) the patient's medical and social history 2) The patient's current medications and supplements 3) The patient's height, weight, and BMI have been recorded in the chart

## 2021-06-15 ENCOUNTER — Encounter: Payer: Self-pay | Admitting: Internal Medicine

## 2021-06-15 DIAGNOSIS — H9221 Otorrhagia, right ear: Secondary | ICD-10-CM

## 2021-06-15 DIAGNOSIS — H9201 Otalgia, right ear: Secondary | ICD-10-CM

## 2021-07-30 ENCOUNTER — Ambulatory Visit (INDEPENDENT_AMBULATORY_CARE_PROVIDER_SITE_OTHER): Payer: 59 | Admitting: Otolaryngology

## 2021-08-02 ENCOUNTER — Other Ambulatory Visit: Payer: Self-pay

## 2021-08-02 ENCOUNTER — Ambulatory Visit (INDEPENDENT_AMBULATORY_CARE_PROVIDER_SITE_OTHER): Payer: 59 | Admitting: Otolaryngology

## 2021-08-02 DIAGNOSIS — H6121 Impacted cerumen, right ear: Secondary | ICD-10-CM | POA: Diagnosis not present

## 2021-08-02 DIAGNOSIS — H60311 Diffuse otitis externa, right ear: Secondary | ICD-10-CM

## 2021-08-02 NOTE — Progress Notes (Signed)
HPI: Christy [name] is a 55 y.o. female who presents is referred by her PCP Dr. Jenny Reichmann for evaluation of chronic drainage from her right ear.  She has had several previous surgeries on her right ear last time in 34 enrolled at [place].  She does not hear out of the right ear.  She has had previous mastoid surgeries a couple of times.  Her left ear is doing well..  Past Medical History:  Diagnosis Date   Anxiety    Anxiety and depression 07/19/2020   Ear infection    Recurrent; lots of scar tissue, surgeries on TM, eventually TM removed.   History of adenomatous polyp of colon 03/2016   Recall 5 yrs (Dr. Havery Moros)   History of pleurisy 1996   Hyperlipidemia    statin recommended 10/2017   Left breast mass 2015   Appears benign, no bx done, getting serial mammograms via [facility].  stable as of 08/2017.  Repeat imaging 6 mo--stable 02/2018.  Repeat 08/2018 showed changes so bx was done--BENIGN FIBROCYSTIC CHANGES!  Plan for rpt diag mammo/ultras 6 mo.   Positive skin test for tuberculosis age 58 approx   father had TB and died when she was 59; pt doesn't recall having CXR or getting TB tx.   Rib fracture 12/2014   s/p fall   Tobacco dependence    UTI (urinary tract infection)    Past Surgical History:  Procedure Laterality Date   COLONOSCOPY  03/18/2016   Tubular adenoma x 1.  Recall 5 yrs   TUBAL LIGATION     tympanectomy     WISDOM TOOTH EXTRACTION     Social History   Socioeconomic History   Marital status: Married    Spouse name: Not on file   Number of children: Not on file   Years of education: Not on file   Highest education level: Not on file  Occupational History   Not on file  Tobacco Use   Smoking status: Every Day    Packs/day: 1.00    Years: 20.00    Pack years: 20.00    Types: E-cigarettes, Cigarettes   Smokeless tobacco: Never   Tobacco comments:    e-vapor cigarettes -3 mg nicotine and does 3 0z a day   Vaping Use   Vaping Use: Every day   Substance and Sexual Activity   Alcohol use: Yes    Alcohol/week: 0.0 standard drinks    Comment: socially   Drug use: No   Sexual activity: Yes    Birth control/protection: Post-menopausal    Comment: first intercourse- 34, 5 partners  Other Topics Concern   Not on file  Social History Narrative   Married, 3 children.   Housewife.   Orig from [place], relocated to [facility] approx 2007.   +Smoker.   Social Determinants of Health   Financial Resource Strain: Not on file  Food Insecurity: Not on file  Transportation Needs: Not on file  Physical Activity: Not on file  Stress: Not on file  Social Connections: Not on file   Family History  Problem Relation Age of Onset   Breast cancer Mother    Cancer Mother        uterine   Hypertension Mother    Stroke Mother    Heart disease Mother    COPD Mother    Bladder Cancer Mother    Cancer Maternal Grandmother        uterine   Stroke Maternal Grandmother    Heart  disease Maternal Grandmother    Hypertension Maternal Grandmother    Emphysema Father    Tuberculosis Father    Colon polyps Neg Hx    Colon cancer Neg Hx    Esophageal cancer Neg Hx    Rectal cancer Neg Hx    Stomach cancer Neg Hx    No Known Allergies Prior to Admission medications   Medication Sig Start Date End Date Taking? Authorizing Provider  acetaminophen (TYLENOL) 500 MG tablet Take 500 mg by mouth every 6 (six) hours as needed.    [provider]  ALPRAZolam Duanne Moron) 0.5 MG tablet Take 1 tablet (0.5 mg total) by mouth at bedtime as needed for anxiety. 07/19/20   Biagio Borg, MD  cholecalciferol (VITAMIN D3) 25 MCG (1000 UNIT) tablet Take 5,000 Units by mouth daily.    [provider]  FLUoxetine (PROZAC) 40 MG capsule Take 40 mg by mouth daily. 01/12/21   [provider]  losartan (COZAAR) 50 MG tablet Take 1 tablet (50 mg total) by mouth daily. 11/16/20   Biagio Borg, MD  pantoprazole (PROTONIX) 40 MG tablet Take 1 tablet (40 mg  total) by mouth daily. 11/16/20 11/16/21  Biagio Borg, MD  rosuvastatin (CRESTOR) 40 MG tablet Take 1 tablet (40 mg total) by mouth daily. 11/16/20   Biagio Borg, MD  triamcinolone (KENALOG) 0.1 % Apply 1 application topically 2 (two) times daily. 11/16/20 11/16/21  Biagio Borg, MD  vitamin B-12 (CYANOCOBALAMIN) 1000 MCG tablet Take 1 tablet (1,000 mcg total) by mouth daily. 11/16/20   Biagio Borg, MD     Positive ROS: Otherwise negative  All other systems have been reviewed and were otherwise negative with the exception of those mentioned in the HPI and as above.  Physical Exam: Constitutional: Alert, well-appearing, no acute distress Ears: External ears without lesions or tenderness.  Right ear canal and right TM are normal.  Left ear canal reveals a mastoid cavity with little bit of granulation tissue in the mastoid cavity.  This was cleaned with suction and after cleaning the right mastoid cavity I applied gentian violet, Ciprodex and CSF powder to the right ear. Nasal: External nose without lesions. Septum with mild deformity. Clear nasal passages Oral: Lips and gums without lesions. Tongue and palate mucosa without lesions. Posterior oropharynx clear. Neck: No palpable adenopathy or masses Respiratory: Breathing comfortably  Skin: No facial/neck lesions or rash noted.  Cerumen impaction removal  Date/Time: 08/02/2021 1:25 PM Performed by: Rozetta Nunnery, MD Authorized by: Rozetta Nunnery, MD   Consent:    Consent obtained:  Verbal   Consent given by:  Patient   Risks discussed:  Pain and bleeding Procedure details:    Location:  R ear   Procedure type: suction   Post-procedure details:    Inspection:  TM intact and canal normal   Hearing quality:  Improved   Procedure completion:  Tolerated well, no immediate complications Comments:     Right ear canal right mastoid cavity was cleaned with suction.  She has some granulation tissue within the mastoid cavity  and I applied gentian violet, Ciprodex and CSF powder to the right ear.  Assessment: Patient is status post previous right ear surgery with right mastoid cavity there with granulation tissue and chronic inflammation.  Plan: I cleaned the mastoid cavity in the office today and applied gentian violet, Ciprodex and CSF powder to the ear mastoid cavity. Recommended keeping it dry. She has previously tried antibiotic  eardrops without much success. I suggested trying to irrigate the mastoid cavity with alcohol and vinegar mixture twice a day on a regular basis if she has any persistent or recurrent drainage. She will follow-up in 6 weeks if the drainage persist.   Radene Journey, MD   CC:

## 2021-08-23 ENCOUNTER — Other Ambulatory Visit: Payer: Self-pay | Admitting: Internal Medicine

## 2021-09-28 ENCOUNTER — Other Ambulatory Visit: Payer: Self-pay | Admitting: Internal Medicine

## 2021-11-13 ENCOUNTER — Other Ambulatory Visit: Payer: Self-pay | Admitting: Internal Medicine

## 2021-11-13 NOTE — Telephone Encounter (Signed)
Please refill as per office routine med refill policy (all routine meds to be refilled for 3 mo or monthly (per pt preference) up to one year from last visit, then month to month grace period for 3 mo, then further med refills will have to be denied) ? ?

## 2021-11-21 ENCOUNTER — Encounter: Payer: 59 | Admitting: Internal Medicine

## 2021-12-05 ENCOUNTER — Ambulatory Visit (INDEPENDENT_AMBULATORY_CARE_PROVIDER_SITE_OTHER): Payer: 59 | Admitting: Internal Medicine

## 2021-12-05 ENCOUNTER — Encounter: Payer: Self-pay | Admitting: Internal Medicine

## 2021-12-05 ENCOUNTER — Other Ambulatory Visit: Payer: Self-pay

## 2021-12-05 VITALS — BP 150/80 | HR 87 | Temp 98.1°F | Ht 64.0 in | Wt 157.0 lb

## 2021-12-05 DIAGNOSIS — E538 Deficiency of other specified B group vitamins: Secondary | ICD-10-CM

## 2021-12-05 DIAGNOSIS — E559 Vitamin D deficiency, unspecified: Secondary | ICD-10-CM

## 2021-12-05 DIAGNOSIS — R739 Hyperglycemia, unspecified: Secondary | ICD-10-CM

## 2021-12-05 DIAGNOSIS — I10 Essential (primary) hypertension: Secondary | ICD-10-CM | POA: Diagnosis not present

## 2021-12-05 DIAGNOSIS — E7800 Pure hypercholesterolemia, unspecified: Secondary | ICD-10-CM | POA: Diagnosis not present

## 2021-12-05 DIAGNOSIS — Z86010 Personal history of colonic polyps: Secondary | ICD-10-CM

## 2021-12-05 NOTE — Progress Notes (Signed)
Patient ID: Christy [name], female   DOB: 08/08/1966, 55 y.o.   MRN: [id number]        Chief Complaint: follow up HTN, hld, low vit D and B12       HPI:  Christy [name] is a 55 y.o. female here overall doing ok, trying to follow lower chol diet.  Mother had severe HLD as well.  Has been taking crestor 40 with good compliance  Pt denies chest pain, increased sob or doe, wheezing, orthopnea, PND, increased LE swelling, palpitations, dizziness or syncope.   Pt denies polydipsia, polyuria, or new focal neuro s/s.  Is willing to consider Card Ct score.  BP has been < 140/90 at home, does not want med change for now.  Has been taking B12 and Vit D.  BP at home about 130's.  Has GYn appt for jan 2023.  No other new complaints.   Pt denies fever, wt loss, night sweats, loss of appetite, or other constitutional symptoms , in fact has gained several lbs in the holidays  Due for colonoscopy Wt Readings from Last 3 Encounters:  12/05/21 157 lb (71.2 kg)  05/17/21 140 lb 3.2 oz (63.6 kg)  01/31/21 140 lb (63.5 kg)   BP Readings from Last 3 Encounters:  12/05/21 (!) 150/80  05/17/21 126/78  01/31/21 110/70         Past Medical History:  Diagnosis Date   Anxiety    Anxiety and depression 07/19/2020   Ear infection    Recurrent; lots of scar tissue, surgeries on TM, eventually TM removed.   History of adenomatous polyp of colon 03/2016   Recall 5 yrs (Dr. Havery Moros)   History of pleurisy 1996   Hyperlipidemia    statin recommended 10/2017   Left breast mass 2015   Appears benign, no bx done, getting serial mammograms via [facility].  stable as of 08/2017.  Repeat imaging 6 mo--stable 02/2018.  Repeat 08/2018 showed changes so bx was done--BENIGN FIBROCYSTIC CHANGES!  Plan for rpt diag mammo/ultras 6 mo.   Positive skin test for tuberculosis age 47 approx   father had TB and died when she was 20; pt doesn't recall having CXR or getting TB tx.   Rib fracture 12/2014   s/p fall   Tobacco dependence     UTI (urinary tract infection)    Past Surgical History:  Procedure Laterality Date   COLONOSCOPY  03/18/2016   Tubular adenoma x 1.  Recall 5 yrs   TUBAL LIGATION     tympanectomy     WISDOM TOOTH EXTRACTION      reports that she has been smoking e-cigarettes and cigarettes. She has a 20.00 pack-year smoking history. She has never used smokeless tobacco. She reports current alcohol use. She reports that she does not use drugs. family history includes Bladder Cancer in her mother; Breast cancer in her mother; COPD in her mother; Cancer in her maternal grandmother and mother; Emphysema in her father; Heart disease in her maternal grandmother and mother; Hypertension in her maternal grandmother and mother; Stroke in her maternal grandmother and mother; Tuberculosis in her father. No Known Allergies Current Outpatient Medications on File Prior to Visit  Medication Sig Dispense Refill   acetaminophen (TYLENOL) 500 MG tablet Take 500 mg by mouth every 6 (six) hours as needed.     ALPRAZolam (XANAX) 0.5 MG tablet Take 1 tablet (0.5 mg total) by mouth at bedtime as needed for anxiety. 30 tablet 1   cholecalciferol (VITAMIN  D3) 25 MCG (1000 UNIT) tablet Take 5,000 Units by mouth daily.     [facility] VITAMIN B12 1000 MCG tablet TAKE 1 TABLET BY MOUTH EVERY DAY 90 tablet 3   FLUoxetine (PROZAC) 40 MG capsule TAKE 1 CAPSULE BY MOUTH  DAILY 90 capsule 2   losartan (COZAAR) 50 MG tablet TAKE 1 TABLET BY MOUTH EVERY DAY 30 tablet 5   pantoprazole (PROTONIX) 40 MG tablet TAKE 1 TABLET BY MOUTH EVERY DAY 30 tablet 5   rosuvastatin (CRESTOR) 40 MG tablet TAKE 1 TABLET BY MOUTH EVERY DAY 30 tablet 5   No current facility-administered medications on file prior to visit.        ROS:  All others reviewed and negative.  Objective        PE:  BP (!) 150/80 (BP Location: Right Arm, Patient Position: Sitting, Cuff Size: Normal)    Pulse 87    Temp 98.1 F (36.7 C) (Oral)    Ht 5\' 4"  (1.626 m)    Wt 157 lb (71.2  kg)    LMP 12/28/2013    SpO2 97%    BMI 26.95 kg/m                 Constitutional: Pt appears in NAD               HENT: Head: NCAT.                Right Ear: External ear normal.                 Left Ear: External ear normal.                Eyes: . Pupils are equal, round, and reactive to light. Conjunctivae and EOM are normal               Nose: without d/c or deformity               Neck: Neck supple. Gross normal ROM               Cardiovascular: Normal rate and regular rhythm.                 Pulmonary/Chest: Effort normal and breath sounds without rales or wheezing.                Abd:  Soft, NT, ND, + BS, no organomegaly               Neurological: Pt is alert. At baseline orientation, motor grossly intact               Skin: Skin is warm. No rashes, no other new lesions, LE edema - none               Psychiatric: Pt behavior is normal without agitation   Micro: none  Cardiac tracings I have personally interpreted today:  none  Pertinent Radiological findings (summarize): none   Lab Results  Component Value Date   WBC 9.4 11/16/2020   HGB 15.1 (H) 11/16/2020   HCT 44.8 11/16/2020   PLT 272.0 11/16/2020   GLUCOSE 91 11/16/2020   CHOL 314 (H) 11/16/2020   TRIG 143.0 11/16/2020   HDL 57.80 11/16/2020   LDLCALC 227 (H) 11/16/2020   ALT 17 11/16/2020   AST 19 11/16/2020   NA 139 11/16/2020   K 4.5 11/16/2020   CL 103 11/16/2020   CREATININE 0.75 11/16/2020   BUN 8 11/16/2020  CO2 28 11/16/2020   TSH 0.67 11/16/2020   HGBA1C 5.5 11/16/2020   Assessment/Plan:  Christy [name] is a 55 y.o. White or Caucasian [1] female with  has a past medical history of Anxiety, Anxiety and depression (07/19/2020), Ear infection, History of adenomatous polyp of colon (03/2016), History of pleurisy (1996), Hyperlipidemia, Left breast mass (2015), Positive skin test for tuberculosis (age 68 approx), Rib fracture (12/2014), Tobacco dependence, and UTI (urinary tract  infection).  Hyperlipidemia Lab Results  Component Value Date   LDLCALC 227 (H) 11/16/2020   Chronic very severe despite crestor 40 qd with good compliance per pt, pt to continue current statin, low chol diet, and card CT score   Hyperglycemia Lab Results  Component Value Date   HGBA1C 5.5 11/16/2020   Stable, pt to continue current medical treatment  - diet   HTN (hypertension) BP Readings from Last 3 Encounters:  12/05/21 (!) 150/80  05/17/21 126/78  01/31/21 110/70   Mild uncontrolled today, pt to continue medical treatment losartan 50 as pt states BP controlled at home, does not want change for now, to work on better diet , exercise, low salt and wt control   Vitamin B12 deficiency Lab Results  Component Value Date   VITAMINB12 926 (H) 11/16/2020   Stable, cont oral replacement - b12 1000 mcg qd   Vitamin D deficiency Last vitamin D Lab Results  Component Value Date   VD25OH 40.46 11/16/2020   Stable, cont oral replacement   History of colon polyps For colonoscopy as is due Followup: Return in about 6 months (around 06/05/2022).  Cathlean Cower, MD 12/06/2021 4:56 AM [facility]

## 2021-12-05 NOTE — Patient Instructions (Addendum)
You will be contacted regarding the referral for: colonoscopy  We have discussed the Cardiac CT Score test to measure the calcification level (if any) in your heart arteries.  This test has been ordered in our [place], so please call [facility] directly, as they prefer this, at [phone] to be scheduled.  Please continue all other medications as before, and refills have been done if requested.  Please have the pharmacy call with any other refills you may need.  Please continue your efforts at being more active, low cholesterol diet, and weight control.  You are otherwise up to date with prevention measures today.  Please keep your appointments with your specialists as you may have planned  Please make an Appointment to return for your 1 year visit, or sooner if needed, with Lab testing by Appointment as well, to be done about 3-5 days before at the [facility] (so this is for TWO appointments - please see the scheduling desk as you leave)   Due to the ongoing Covid 19 pandemic, our lab now requires an appointment for any labs done at our office.  If you need labs done and do not have an appointment, please call our office ahead of time to schedule before presenting to the lab for your testing.

## 2021-12-06 ENCOUNTER — Encounter: Payer: Self-pay | Admitting: Internal Medicine

## 2021-12-06 DIAGNOSIS — Z860100 Personal history of colon polyps, unspecified: Secondary | ICD-10-CM | POA: Insufficient documentation

## 2021-12-06 NOTE — Assessment & Plan Note (Signed)
Lab Results  Component Value Date   LDLCALC 227 (H) 11/16/2020   Chronic very severe despite crestor 40 qd with good compliance per pt, pt to continue current statin, low chol diet, and card CT score

## 2021-12-06 NOTE — Assessment & Plan Note (Signed)
Last vitamin D Lab Results  Component Value Date   VD25OH 40.46 11/16/2020   Stable, cont oral replacement  

## 2021-12-06 NOTE — Assessment & Plan Note (Signed)
Lab Results  Component Value Date   VITAMINB12 926 (H) 11/16/2020   Stable, cont oral replacement - b12 1000 mcg qd  

## 2021-12-06 NOTE — Assessment & Plan Note (Signed)
BP Readings from Last 3 Encounters:  12/05/21 (!) 150/80  05/17/21 126/78  01/31/21 110/70   Mild uncontrolled today, pt to continue medical treatment losartan 50 as pt states BP controlled at home, does not want change for now, to work on better diet , exercise, low salt and wt control

## 2021-12-06 NOTE — Assessment & Plan Note (Signed)
For colonoscopy as is due 

## 2021-12-06 NOTE — Assessment & Plan Note (Signed)
Lab Results  Component Value Date   HGBA1C 5.5 11/16/2020   Stable, pt to continue current medical treatment  - diet

## 2021-12-31 ENCOUNTER — Ambulatory Visit: Payer: 59 | Admitting: Nurse Practitioner

## 2022-01-01 ENCOUNTER — Ambulatory Visit (INDEPENDENT_AMBULATORY_CARE_PROVIDER_SITE_OTHER): Payer: 59 | Admitting: Nurse Practitioner

## 2022-01-01 ENCOUNTER — Encounter: Payer: Self-pay | Admitting: Nurse Practitioner

## 2022-01-01 ENCOUNTER — Other Ambulatory Visit (HOSPITAL_COMMUNITY)
Admission: RE | Admit: 2022-01-01 | Discharge: 2022-01-01 | Disposition: A | Discharge status: Home / Self Care | Payer: 59 | Source: Ambulatory Visit | Attending: Nurse Practitioner | Admitting: Nurse Practitioner

## 2022-01-01 ENCOUNTER — Other Ambulatory Visit: Payer: Self-pay

## 2022-01-01 VITALS — BP 124/78 | Ht 63.0 in | Wt 162.4 lb

## 2022-01-01 DIAGNOSIS — Z01419 Encounter for gynecological examination (general) (routine) without abnormal findings: Secondary | ICD-10-CM | POA: Diagnosis present

## 2022-01-01 DIAGNOSIS — Z780 Asymptomatic menopausal state: Secondary | ICD-10-CM | POA: Diagnosis not present

## 2022-01-01 DIAGNOSIS — Z809 Family history of malignant neoplasm, unspecified: Secondary | ICD-10-CM

## 2022-01-01 NOTE — Progress Notes (Signed)
Christy [name] 26-Feb-1966 [id number]   History:  56 y.o. [id number] present for annual visit. Postmenopausal -  No HRT, no bleeding. Normal pap and mammogram history. Anxiety, depression, HTN, HLD managed by PCP. Husband had ankle surgery last week after tripping over dog. Smoker.   Gynecologic History Patient's last menstrual period was 12/28/2013. Period Duration (Days): PMP Contraception/Family planning: post menopausal status Sexually active: Yes  Health Maintenance Last Pap: 10/2017. Results were: Normal, 5-year repeat Last mammogram: 03/16/2021. Results were: Right axillary mass, negative biopsy Last colonoscopy: 03/18/2016. Results were: Adenoma, 5-year recall Last Dexa: Never  Past medical history, past surgical history, family history and social history were all reviewed and documented in the EPIC chart. Married. 3 children ages 59, 27, and 31. Mother with history of uterine cancer, breast cancer x 2, bladder cancer. MGM with history of uterine cancer.   ROS:  A ROS was performed and pertinent positives and negatives are included.  Exam:  Vitals:   01/01/22 1129  BP: 124/78  Weight: 162 lb 6.4 oz (73.7 kg)  Height: 5\' 3"  (1.6 m)    Body mass index is 28.77 kg/m.  General appearance:  Normal Thyroid:  Symmetrical, normal in size, without palpable masses or nodularity. Respiratory  Auscultation:  Clear without wheezing or rhonchi Cardiovascular  Auscultation:  Regular rate, without rubs, murmurs or gallops  Edema/varicosities:  Not grossly evident Abdominal  Soft,nontender, without masses, guarding or rebound.  Liver/spleen:  No organomegaly noted  Hernia:  None appreciated  Skin  Inspection:  Grossly normal   Breasts: Examined lying and sitting.   Right: Without masses, retractions, discharge or axillary adenopathy.   Left: Without masses, retractions, discharge or axillary adenopathy. Genitourinary   Inguinal/mons:  Normal without inguinal adenopathy  External  genitalia:  Normal appearing vulva with no masses, tenderness, or lesions  BUS/Urethra/Skene's glands:  Normal  Vagina:  Normal appearing with normal color and discharge, no lesions  Cervix:  Normal appearing without discharge or lesions  Uterus:  Normal in size, shape and contour.  Midline and mobile, nontender  Adnexa/parametria:     Rt: Normal in size, without masses or tenderness.   Lt: Normal in size, without masses or tenderness.  Anus and perineum: Normal  Digital rectal exam: Normal sphincter tone without palpated masses or tenderness  Patient informed chaperone available to be present for breast and pelvic exam. Patient has requested no chaperone to be present. Patient has been advised what will be completed during breast and pelvic exam.   Assessment/Plan:  56 y.o. [name] for annual exam.   Well female exam with routine gynecological exam - Plan: Cytology - PAP( [facility]). Education provided on SBEs, importance of preventative screenings, current guidelines, high calcium diet, regular exercise, and multivitamin daily.  Labs with PCP.   Postmenopausal - no HRT, no bleeding  Family history of cancer - Mother with history of uterine cancer, breast cancer x 2, bladder cancer. MGM with history of uterine cancer. Recommend genetic testing for mother but patient does not feel she will do this, so it is recommended she see genetics. Due to recent costs of husband's surgery she wants to wait on this.   Screening for cervical cancer - Normal Pap history. Pap today.   Screening for breast cancer - History of benign breast biopsies x 2, most recently 03/2021 in right axillary. Normal breast exam today. Continue annual screenings.   Screening for colon cancer - 2017 colonoscopy. Overdue but PCP has sent referral.   Follow-up  in 1 year for annual.    Tamela Gammon [facility], 11:48 AM 01/01/2022

## 2022-01-02 LAB — CYTOLOGY - PAP
Adequacy: ABSENT
Comment: NEGATIVE
Diagnosis: NEGATIVE
High risk HPV: NEGATIVE

## 2022-01-03 ENCOUNTER — Ambulatory Visit (INDEPENDENT_AMBULATORY_CARE_PROVIDER_SITE_OTHER)
Admission: RE | Admit: 2022-01-03 | Discharge: 2022-01-03 | Disposition: A | Discharge status: Home / Self Care | Payer: Self-pay | Source: Ambulatory Visit | Attending: Internal Medicine | Admitting: Internal Medicine

## 2022-01-03 ENCOUNTER — Other Ambulatory Visit: Payer: Self-pay

## 2022-01-03 ENCOUNTER — Encounter: Payer: Self-pay | Admitting: Internal Medicine

## 2022-01-03 DIAGNOSIS — R739 Hyperglycemia, unspecified: Secondary | ICD-10-CM

## 2022-01-03 DIAGNOSIS — E7800 Pure hypercholesterolemia, unspecified: Secondary | ICD-10-CM

## 2022-01-03 DIAGNOSIS — I10 Essential (primary) hypertension: Secondary | ICD-10-CM

## 2022-04-21 ENCOUNTER — Other Ambulatory Visit: Payer: Self-pay | Admitting: Internal Medicine

## 2022-05-07 ENCOUNTER — Ambulatory Visit (INDEPENDENT_AMBULATORY_CARE_PROVIDER_SITE_OTHER): Payer: 59 | Admitting: Gastroenterology

## 2022-05-07 ENCOUNTER — Encounter: Payer: Self-pay | Admitting: Gastroenterology

## 2022-05-07 VITALS — BP 102/68 | HR 82 | Ht 63.5 in | Wt 170.0 lb

## 2022-05-07 DIAGNOSIS — K219 Gastro-esophageal reflux disease without esophagitis: Secondary | ICD-10-CM

## 2022-05-07 DIAGNOSIS — Z79899 Other long term (current) drug therapy: Secondary | ICD-10-CM | POA: Diagnosis not present

## 2022-05-07 DIAGNOSIS — R194 Change in bowel habit: Secondary | ICD-10-CM

## 2022-05-07 DIAGNOSIS — R1011 Right upper quadrant pain: Secondary | ICD-10-CM

## 2022-05-07 DIAGNOSIS — Z86010 Personal history of colonic polyps: Secondary | ICD-10-CM

## 2022-05-07 DIAGNOSIS — R6881 Early satiety: Secondary | ICD-10-CM

## 2022-05-07 MED ORDER — POLYETHYLENE GLYCOL 3350 17 G PO PACK: 17.0000 g | PACK | Freq: Every day | ORAL | 0 refills | Status: AC

## 2022-05-07 NOTE — Progress Notes (Signed)
HPI :  56 year old female with a history of colon polyps, hyperlipidemia, anxiety, here to reestablish care for multiple GI tract symptoms as outlined.  She was last seen here in April 2017 for her colonoscopy.  She reports having a history of reflux for several years.  She has been on Protonix 40 mg daily for a long time.  It works pretty well for her at baseline.  She denies any significant breakthrough of heartburn.  No dysphagia.  She has had some epigastric to right upper quadrant discomfort for the past month or so.  She states this comes and goes, its quite fleeting when she gets it, last for short period of time.  Twisting movements can reproduce her symptoms.  She did not really have any postprandial reproduction of her pain but she does have a early satiety that has been bothering her lately.  She does have a prior tobacco use history, quit tobacco about 2 years ago.  No family history of esophageal or gastric cancer.  Her gallbladder is in place.  She has never had a prior EGD.  She has gained about 40 pounds in the past year, she is not sure why, she previously was around 130 pounds.  She denies any routine NSAID use.  She has had some change in her bowel habits.  She states about 80% of the time her stools are constipated and hard to have a bowel movement, she will then have episodes where a lot of stool comes out at one time which can be loose.  She has tried Metamucil and Benefiber in the past without any help.  She is not had any significant changes to her diet.  She denies any blood in her stools.  She has some abdominal bloating when she is constipated which can improve with a bowel movement.  Her last colonoscopy was in 2017, she had 1 small adenoma removed, due for a surveillance exam in April 2024.     Colonoscopy 03/18/2016 - The perianal and digital rectal examinations were normal. - A 4 mm polyp was found in the ascending colon. The polyp was sessile. The polyp was removed with  a cold snare. Resection and retrieval were complete. - A 4 mm polyp was found in the rectum. The polyp was sessile. The polyp was removed with a cold snare. Resection and retrieval were complete. - The exam was otherwise without abnormality.  Surgical [P], ascending, rectal, polyps(2) -TUBULAR ADENOMA AND HYPERPLASTIC COLONIC POLYP. -NO HIGH GRADE DYSPLASIA OR MALIGNANCY IDENTIFIED.  Recall April 2024   Cardiac CT 01/03/22: coronary calcium score of 1.4 IMPRESSION: Evidence of prior granulomatous disease with calcified granulomata in the right middle lobe and associated calcified subcarinal lymph node.      Past Medical History:  Diagnosis Date   Anxiety    Anxiety and depression 07/19/2020   Ear infection    Recurrent; lots of scar tissue, surgeries on TM, eventually TM removed.   History of adenomatous polyp of colon 03/2016   Recall 5 yrs (Dr. Havery Moros)   History of pleurisy 1996   Hyperlipidemia    statin recommended 10/2017   Left breast mass 2015   Appears benign, no bx done, getting serial mammograms via [facility].  stable as of 08/2017.  Repeat imaging 6 mo--stable 02/2018.  Repeat 08/2018 showed changes so bx was done--BENIGN FIBROCYSTIC CHANGES!  Plan for rpt diag mammo/ultras 6 mo.   Positive skin test for tuberculosis age 62 approx   father had  TB and died when she was 27; pt doesn't recall having CXR or getting TB tx.   Rib fracture 12/2014   s/p fall   Tobacco dependence    UTI (urinary tract infection)      Past Surgical History:  Procedure Laterality Date   COLONOSCOPY  03/18/2016   Tubular adenoma x 1.  Recall 5 yrs   TUBAL LIGATION     tympanectomy     WISDOM TOOTH EXTRACTION     Family History  Problem Relation Age of Onset   Breast cancer Mother    Cancer Mother        uterine, bladder   Hypertension Mother    Stroke Mother    Heart disease Mother    COPD Mother    Bladder Cancer Mother    Emphysema Father    Tuberculosis Father     Cancer Maternal Grandmother        uterine   Stroke Maternal Grandmother    Heart disease Maternal Grandmother    Hypertension Maternal Grandmother    Colon polyps Neg Hx    Colon cancer Neg Hx    Esophageal cancer Neg Hx    Rectal cancer Neg Hx    Stomach cancer Neg Hx    Social History   Tobacco Use   Smoking status: Every Day    Packs/day: 1.00    Years: 20.00    Pack years: 20.00    Types: E-cigarettes, Cigarettes   Smokeless tobacco: Never   Tobacco comments:    e-vapor cigarettes -3 mg nicotine and does 3 0z a day   Vaping Use   Vaping Use: Every day  Substance Use Topics   Alcohol use: Yes    Alcohol/week: 0.0 standard drinks    Comment: socially   Drug use: No   Current Outpatient Medications  Medication Sig Dispense Refill   acetaminophen (TYLENOL) 500 MG tablet Take 500 mg by mouth every 6 (six) hours as needed.     ALPRAZolam (XANAX) 0.5 MG tablet Take 1 tablet (0.5 mg total) by mouth at bedtime as needed for anxiety. 30 tablet 1   cholecalciferol (VITAMIN D3) 25 MCG (1000 UNIT) tablet Take 5,000 Units by mouth daily.     [employer] VITAMIN B12 1000 MCG tablet TAKE 1 TABLET BY MOUTH EVERY DAY 90 tablet 3   FLUoxetine (PROZAC) 40 MG capsule TAKE 1 CAPSULE BY MOUTH  DAILY 90 capsule 1   losartan (COZAAR) 50 MG tablet TAKE 1 TABLET BY MOUTH EVERY DAY 30 tablet 5   pantoprazole (PROTONIX) 40 MG tablet TAKE 1 TABLET BY MOUTH EVERY DAY 30 tablet 5   rosuvastatin (CRESTOR) 40 MG tablet TAKE 1 TABLET BY MOUTH EVERY DAY 30 tablet 5   No current facility-administered medications for this visit.   No Known Allergies   Review of Systems: All systems reviewed and negative except where noted in HPI.   Lab Results  Component Value Date   WBC 9.4 11/16/2020   HGB 15.1 (H) 11/16/2020   HCT 44.8 11/16/2020   MCV 95.9 11/16/2020   PLT 272.0 11/16/2020    Lab Results  Component Value Date   CREATININE 0.75 11/16/2020   BUN 8 11/16/2020   NA 139 11/16/2020   K 4.5  11/16/2020   CL 103 11/16/2020   CO2 28 11/16/2020    Lab Results  Component Value Date   ALT 17 11/16/2020   AST 19 11/16/2020   ALKPHOS 63 11/16/2020   BILITOT 0.5 11/16/2020  Physical Exam: BP 102/68   Pulse 82   Ht 5' 3.5" (1.613 m)   Wt 170 lb (77.1 kg)   LMP 12/28/2013   SpO2 97%   BMI 29.64 kg/m  Constitutional: Pleasant,well-developed, female in no acute distress. HEENT: Normocephalic and atraumatic. Conjunctivae are normal. No scleral icterus. Neck supple.  Cardiovascular: Normal rate, regular rhythm.  Pulmonary/chest: Effort normal and breath sounds normal. No wheezing, rales or rhonchi. Abdominal: Soft, nondistended, RUQ pain to palpation along costal margin with positive carnett. There are no masses palpable. No hepatomegaly. Extremities: no edema Lymphadenopathy: No cervical adenopathy noted. Neurological: Alert and oriented to person place and time. Skin: Skin is warm and dry. No rashes noted. Psychiatric: Normal mood and affect. Behavior is normal.   ASSESSMENT AND PLAN: 56 year old female here to establish care for the following:  GERD Long-term use of PPI Early satiety Right upper quadrant pain Altered bowel habits History of colon polyps  Discussed these issues as above.  Longstanding reflux on Protonix which works pretty well for her.  Given her duration of symptoms, age, ethnicity, tobacco use history, she meets criteria for Barrett's screening.  I offered her an EGD in this light and to evaluate her symptoms of early satiety, and abdominal pain.  I discussed risk benefits of endoscopy and anesthesia and she wants to proceed.  We discussed long-term risks of chronic PPI use.  Long-term want to use the lowest dose of PPI needed to control symptoms.  If she has no Barrett's esophagus or erosive changes would decrease her Protonix to 20 mg daily and potentially transition to an H2 blocker if she would tolerate it.  I think her abdominal pain is  likely musculoskeletal / abdominal wall pain, she has a positive Carnett's sign.  Perhaps related to her recent weight gain.  She can try some topical muscle rubs or trial NSAID to see if that will help.  Having some constipation with what sounds like overflow.  Fiber has not helped.  Recommend MiraLAX daily and titrate up or down as needed.  Her colonoscopy is up-to-date, due for surveillance colonoscopy next year.  Contact me if no improvement with this regimen.  Plan: - schedule EGD in the [place] - discussed risks of PPI, will titrate down if EGD okay - topical muscle rub on abdomen - musculoskeletal strain - MIralax every day and titrate up or down as needed - samples given - due for colonoscopy next year  Jolly Mango, MD [facility]  CC: Biagio Borg, MD

## 2022-05-07 NOTE — Patient Instructions (Addendum)
If you are age 57 or older, your body mass index should be between 23-30. Your Body mass index is 29.64 kg/m. If this is out of the aforementioned range listed, please consider follow up with your Primary Care Provider.  If you are age 71 or younger, your body mass index should be between 19-25. Your Body mass index is 29.64 kg/m. If this is out of the aformentioned range listed, please consider follow up with your Primary Care Provider.   ________________________________________________________  The [facility] providers would like to encourage you to use [facility] to communicate with providers for non-urgent requests or questions.  Due to long hold times on the telephone, sending your provider a message by [facility] may be a faster and more efficient way to get a response.  Please allow 48 business hours for a response.  Please remember that this is for non-urgent requests.  _______________________________________________________  Christy [name] have been scheduled for an endoscopy. Please follow written instructions given to you at your visit today. If you use inhalers (even only as needed), please bring them with you on the day of your procedure.  Please purchase the following medications over the counter and take as directed: Topical rub for muscle strain  Miralax : Use as directed once daily  Thank you for entrusting me with your care and for choosing [employer], Dr.  Cellar

## 2022-05-08 ENCOUNTER — Encounter: Payer: Self-pay | Admitting: Gastroenterology

## 2022-05-08 ENCOUNTER — Ambulatory Visit (AMBULATORY_SURGERY_CENTER): Payer: 59 | Admitting: Gastroenterology

## 2022-05-08 VITALS — BP 123/78 | HR 75 | Temp 98.4°F | Resp 12 | Ht 63.0 in | Wt 170.0 lb

## 2022-05-08 DIAGNOSIS — R1011 Right upper quadrant pain: Secondary | ICD-10-CM

## 2022-05-08 DIAGNOSIS — K2270 Barrett's esophagus without dysplasia: Secondary | ICD-10-CM | POA: Diagnosis not present

## 2022-05-08 DIAGNOSIS — K449 Diaphragmatic hernia without obstruction or gangrene: Secondary | ICD-10-CM | POA: Diagnosis not present

## 2022-05-08 DIAGNOSIS — R6881 Early satiety: Secondary | ICD-10-CM

## 2022-05-08 DIAGNOSIS — K2970 Gastritis, unspecified, without bleeding: Secondary | ICD-10-CM | POA: Diagnosis not present

## 2022-05-08 DIAGNOSIS — K219 Gastro-esophageal reflux disease without esophagitis: Secondary | ICD-10-CM

## 2022-05-08 MED ORDER — SODIUM CHLORIDE 0.9 % IV SOLN: 500.0000 mL | Freq: Once | INTRAVENOUS | Status: DC

## 2022-05-08 NOTE — Op Note (Signed)
[facility] Patient Name: Christy [name] Procedure Date: 05/08/2022 10:48 AM MRN: [id number] Endoscopist: Remo Lipps P. Havery Moros , MD Age: 56 Referring MD:  Date of Birth: 1966/06/03 Gender: Female Account #: [id number] Procedure:                Upper GI endoscopy Indications:              Screening for Barrett's esophagus - longstanding                            GERD, Abdominal pain in the right upper quadrant,                            Early satiety Medicines:                Monitored Anesthesia Care Procedure:                Pre-Anesthesia Assessment:                           - Prior to the procedure, a History and Physical                            was performed, and patient medications and                            allergies were reviewed. The patient's tolerance of                            previous anesthesia was also reviewed. The risks                            and benefits of the procedure and the sedation                            options and risks were discussed with the patient.                            All questions were answered, and informed consent                            was obtained. Prior Anticoagulants: The patient has                            taken no previous anticoagulant or antiplatelet                            agents. ASA Grade Assessment: II - A patient with                            mild systemic disease. After reviewing the risks                            and benefits, the patient was deemed in  satisfactory condition to undergo the procedure.                           After obtaining informed consent, the endoscope was                            passed under direct vision. Throughout the                            procedure, the patient's blood pressure, pulse, and                            oxygen saturations were monitored continuously. The                            GIF [id number] #[id number] was introduced  through the                            mouth, and advanced to the second part of duodenum.                            The upper GI endoscopy was accomplished without                            difficulty. The patient tolerated the procedure                            well. Scope In: Scope Out: Findings:                 Esophagogastric landmarks were identified: the                            Z-line was found at 34 cm, the gastroesophageal                            junction was found at 34 cm and the upper extent of                            the gastric folds was found at 36 cm from the                            incisors.                           A 2 cm hiatal hernia was present.                           The Z-line was irregular with a suspected 1cm                            segment of Barrett's. Biopsies were taken with a                            cold forceps  for histology.                           The exam of the esophagus was otherwise normal.                           The entire examined stomach was normal. Biopsies                            were taken with a cold forceps for Helicobacter                            pylori testing.                           The duodenum was normal. Complications:            No immediate complications. Estimated blood loss:                            Minimal. Estimated Blood Loss:     Estimated blood loss was minimal. Impression:               - Esophagogastric landmarks identified.                           - 2 cm hiatal hernia.                           - Z-line irregular, suspected short segment of                            Barrett's. Biopsied.                           - Normal stomach. Biopsied.                           - Normal examined duodenum.                           No cause for abdominal pain noted, which I suspect                            may be musculoskeletal. Biopsies taken to rule out                            H  pylori given symptoms of early satiety. Recommendation:           - Patient has a contact number available for                            emergencies. The signs and symptoms of potential                            delayed complications were discussed with the  patient. Return to normal activities tomorrow.                            Written discharge instructions were provided to the                            patient.                           - Resume previous diet.                           - Continue present medications.                           - Await pathology results. Remo Lipps P. Charly Hunton, MD 05/08/2022 11:09:48 AM This report has been signed electronically.

## 2022-05-08 NOTE — Progress Notes (Signed)
Pt non-responsive, VVS, Report to RN  °

## 2022-05-08 NOTE — Progress Notes (Signed)
Called to room to assist during endoscopic procedure.  Patient ID and intended procedure confirmed with present staff. Received instructions for my participation in the procedure from the performing physician.  

## 2022-05-08 NOTE — Progress Notes (Signed)
Pt's states no medical or surgical changes since previsit or office visit. 

## 2022-05-08 NOTE — Progress Notes (Signed)
History and Physical Interval Note: Patient seen yesterday in the office. No interval changes. Have discussed risks / benefits and she wants to proceed.  05/08/2022 10:51 AM  Christy [name]  has presented today for endoscopic procedure(s), with the diagnosis of  Encounter Diagnoses  Name Primary?   Gastroesophageal reflux disease, unspecified whether esophagitis present Yes   Early satiety    RUQ pain   .  The various methods of evaluation and treatment have been discussed with the patient and/or family. After consideration of risks, benefits and other options for treatment, the patient has consented to  the endoscopic procedure(s).   The patient's history has been reviewed, patient examined, no change in status, stable for surgery.  I have reviewed the patient's chart and labs.  Questions were answered to the patient's satisfaction.    Jolly Mango, MD [facility]

## 2022-05-08 NOTE — Patient Instructions (Signed)
Handout on hiatal hernia given.  Await pathology results.  YOU HAD AN ENDOSCOPIC PROCEDURE TODAY AT [facility]:   Refer to the procedure report that was given to you for any specific questions about what was found during the examination.  If the procedure report does not answer your questions, please call your gastroenterologist to clarify.  If you requested that your care partner not be given the details of your procedure findings, then the procedure report has been included in a sealed envelope for you to review at your convenience later.  YOU SHOULD EXPECT: Some feelings of bloating in the abdomen. Passage of more gas than usual.  Walking can help get rid of the air that was put into your GI tract during the procedure and reduce the bloating. If you had a lower endoscopy (such as a colonoscopy or flexible sigmoidoscopy) you may notice spotting of blood in your stool or on the toilet paper. If you underwent a bowel prep for your procedure, you may not have a normal bowel movement for a few days.  Please Note:  You might notice some irritation and congestion in your nose or some drainage.  This is from the oxygen used during your procedure.  There is no need for concern and it should clear up in a day or so.  SYMPTOMS TO REPORT IMMEDIATELY:   Following upper endoscopy (EGD)  Vomiting of blood or coffee ground material  New chest pain or pain under the shoulder blades  Painful or persistently difficult swallowing  New shortness of breath  Fever of 100F or higher  Black, tarry-looking stools  For urgent or emergent issues, a gastroenterologist can be reached at any hour by calling [phone]. Do not use MyChart messaging for urgent concerns.    DIET:  We do recommend a small meal at first, but then you may proceed to your regular diet.  Drink plenty of fluids but you should avoid alcoholic beverages for 24 hours.  ACTIVITY:  You should plan to take it easy for the  rest of today and you should NOT DRIVE or use heavy machinery until tomorrow (because of the sedation medicines used during the test).    FOLLOW UP: Our staff will call the number listed on your records 48-72 hours following your procedure to check on you and address any questions or concerns that you may have regarding the information given to you following your procedure. If we do not reach you, we will leave a message.  We will attempt to reach you two times.  During this call, we will ask if you have developed any symptoms of COVID 19. If you develop any symptoms (ie: fever, flu-like symptoms, shortness of breath, cough etc.) before then, please call [phone].  If you test positive for Covid 19 in the 2 weeks post procedure, please call and report this information to [place].    If any biopsies were taken you will be contacted by phone or by letter within the next 1-3 weeks.  Please call us at [phone] if you have not heard about the biopsies in 3 weeks.    SIGNATURES/CONFIDENTIALITY: You and/or your care partner have signed paperwork which will be entered into your electronic medical record.  These signatures attest to the fact that that the information above on your After Visit Summary has been reviewed and is understood.  Full responsibility of the confidentiality of this discharge information lies with you and/or your care-partner.

## 2022-05-09 ENCOUNTER — Telehealth: Payer: Self-pay

## 2022-05-09 NOTE — Telephone Encounter (Signed)
  Follow up Call-     05/08/2022    9:38 AM  Call back number  Post procedure Call Back phone  # [phone]  Permission to leave phone message Yes     Patient questions:  Do you have a fever, pain , or abdominal swelling? No. Pain Score  0 *  Have you tolerated food without any problems? Yes.    Have you been able to return to your normal activities? Yes.    Do you have any questions about your discharge instructions: Diet   No. Medications  No. Follow up visit  No.  Do you have questions or concerns about your Care? No.  Actions: * If pain score is 4 or above: No action needed, pain <4.

## 2022-05-25 ENCOUNTER — Other Ambulatory Visit: Payer: Self-pay | Admitting: Internal Medicine

## 2022-05-25 NOTE — Telephone Encounter (Signed)
Please refill as per office routine med refill policy (all routine meds to be refilled for 3 mo or monthly (per pt preference) up to one year from last visit, then month to month grace period for 3 mo, then further med refills will have to be denied) ? ?

## 2022-10-08 ENCOUNTER — Other Ambulatory Visit: Payer: Self-pay | Admitting: Internal Medicine

## 2022-10-21 ENCOUNTER — Other Ambulatory Visit: Payer: Self-pay | Admitting: Internal Medicine

## 2022-11-19 ENCOUNTER — Other Ambulatory Visit: Payer: Self-pay | Admitting: Internal Medicine

## 2022-11-19 NOTE — Telephone Encounter (Signed)
Please refill as per office routine med refill policy (all routine meds to be refilled for 3 mo or monthly (per pt preference) up to one year from last visit, then month to month grace period for 3 mo, then further med refills will have to be denied) ? ?

## 2022-12-11 ENCOUNTER — Encounter: Payer: 59 | Admitting: Internal Medicine

## 2022-12-12 ENCOUNTER — Encounter: Payer: Self-pay | Admitting: Internal Medicine

## 2022-12-12 ENCOUNTER — Ambulatory Visit (INDEPENDENT_AMBULATORY_CARE_PROVIDER_SITE_OTHER): Payer: 59 | Admitting: Internal Medicine

## 2022-12-12 VITALS — BP 138/88 | HR 84 | Temp 98.0°F | Ht 63.0 in | Wt 159.0 lb

## 2022-12-12 DIAGNOSIS — Z86010 Personal history of colonic polyps: Secondary | ICD-10-CM

## 2022-12-12 DIAGNOSIS — R739 Hyperglycemia, unspecified: Secondary | ICD-10-CM | POA: Diagnosis not present

## 2022-12-12 DIAGNOSIS — Z0001 Encounter for general adult medical examination with abnormal findings: Secondary | ICD-10-CM

## 2022-12-12 DIAGNOSIS — E538 Deficiency of other specified B group vitamins: Secondary | ICD-10-CM

## 2022-12-12 DIAGNOSIS — E7800 Pure hypercholesterolemia, unspecified: Secondary | ICD-10-CM

## 2022-12-12 DIAGNOSIS — I10 Essential (primary) hypertension: Secondary | ICD-10-CM

## 2022-12-12 DIAGNOSIS — E559 Vitamin D deficiency, unspecified: Secondary | ICD-10-CM

## 2022-12-12 DIAGNOSIS — K2270 Barrett's esophagus without dysplasia: Secondary | ICD-10-CM

## 2022-12-12 LAB — BASIC METABOLIC PANEL
BUN: 8 mg/dL (ref 6–23)
CO2: 29 mEq/L (ref 19–32)
Calcium: 9.9 mg/dL (ref 8.4–10.5)
Chloride: 102 mEq/L (ref 96–112)
Creatinine, Ser: 0.72 mg/dL (ref 0.40–1.20)
GFR: 92.69 mL/min (ref >60.00)
Glucose, Bld: 103 mg/dL — ABNORMAL HIGH (ref 70–99)
Potassium: 4.7 mEq/L (ref 3.5–5.1)
Sodium: 139 mEq/L (ref 135–145)

## 2022-12-12 LAB — HEPATIC FUNCTION PANEL
ALT: 22 U/L (ref 0–35)
AST: 21 U/L (ref 0–37)
Albumin: 4.6 g/dL (ref 3.5–5.2)
Alkaline Phosphatase: 58 U/L (ref 39–117)
Bilirubin, Direct: 0 mg/dL (ref 0.0–0.3)
Total Bilirubin: 0.4 mg/dL (ref 0.2–1.2)
Total Protein: 7.7 g/dL (ref 6.0–8.3)

## 2022-12-12 LAB — CBC WITH DIFFERENTIAL/PLATELET
Basophils Absolute: 0 10*3/uL (ref 0.0–0.1)
Basophils Relative: 0.8 % (ref 0.0–3.0)
Eosinophils Absolute: 0.2 10*3/uL (ref 0.0–0.7)
Eosinophils Relative: 2.9 % (ref 0.0–5.0)
HCT: 41.8 % (ref 36.0–46.0)
Hemoglobin: 13.8 g/dL (ref 12.0–15.0)
Lymphocytes Relative: 34.1 % (ref 12.0–46.0)
Lymphs Abs: 2 10*3/uL (ref 0.7–4.0)
MCHC: 33.1 g/dL (ref 30.0–36.0)
MCV: 95.3 fl (ref 78.0–100.0)
Monocytes Absolute: 0.4 10*3/uL (ref 0.1–1.0)
Monocytes Relative: 6.5 % (ref 3.0–12.0)
Neutro Abs: 3.3 10*3/uL (ref 1.4–7.7)
Neutrophils Relative %: 55.7 % (ref 43.0–77.0)
Platelets: 271 10*3/uL (ref 150.0–400.0)
RBC: 4.39 Mil/uL (ref 3.87–5.11)
RDW: 14.3 % (ref 11.5–15.5)
WBC: 6 10*3/uL (ref 4.0–10.5)

## 2022-12-12 LAB — LIPID PANEL
Cholesterol: 262 mg/dL — ABNORMAL HIGH (ref 0–200)
HDL: 58.8 mg/dL (ref >39.00)
LDL Cholesterol: 176 mg/dL — ABNORMAL HIGH (ref 0–99)
NonHDL: 203.33
Total CHOL/HDL Ratio: 4
Triglycerides: 137 mg/dL (ref 0.0–149.0)
VLDL: 27.4 mg/dL (ref 0.0–40.0)

## 2022-12-12 LAB — HEMOGLOBIN A1C: Hgb A1c MFr Bld: 5.6 % (ref 4.6–6.5)

## 2022-12-12 MED ORDER — PANTOPRAZOLE SODIUM 40 MG PO TBEC: 40.0000 mg | DELAYED_RELEASE_TABLET | Freq: Every day | ORAL | 3 refills | Status: AC

## 2022-12-12 MED ORDER — LOSARTAN POTASSIUM 100 MG PO TABS: 100.0000 mg | ORAL_TABLET | Freq: Every day | ORAL | 3 refills | Status: AC

## 2022-12-12 MED ORDER — FLUOXETINE HCL 40 MG PO CAPS: 40.0000 mg | ORAL_CAPSULE | Freq: Every day | ORAL | 3 refills | Status: DC

## 2022-12-12 NOTE — Progress Notes (Signed)
Patient ID: Christy [name], female   DOB: 1966/02/06, 56 y.o.   MRN: [id number]         Chief Complaint:: wellness exam and htn, hld, hyperglycemia, low B12 and low Vit D       HPI:  Christy [name] is a 56 y.o. female here for wellness exam; due for colonoscopy, o/w up to date, Now vaping but considering quitting.                          Also Pt denies chest pain, increased sob or doe, wheezing, orthopnea, PND, increased LE swelling, palpitations, dizziness or syncope.   Pt denies polydipsia, polyuria, or new focal neuro s/s.    Pt denies fever, wt loss, night sweats, loss of appetite, or other constitutional symptoms  SBP has been 127 to 138 often at home.    Wt Readings from Last 3 Encounters:  12/12/22 159 lb (72.1 kg)  05/08/22 170 lb (77.1 kg)  05/07/22 170 lb (77.1 kg)   BP Readings from Last 3 Encounters:  12/12/22 138/88  05/08/22 123/78  05/07/22 102/68   Immunization History  Administered Date(s) Administered   PFIZER(Purple Top)SARS-COV-2 Vaccination 04/26/2020, 05/21/2020   Td 12/16/2002   Tdap 01/19/2016   Zoster Recombinat (Shingrix) 11/10/2017, 05/17/2021   Health Maintenance Due  Topic Date Due   COLONOSCOPY (Pts 45-66yr Insurance coverage will need to be confirmed)  03/18/2021      Past Medical History:  Diagnosis Date   Anxiety    Anxiety and depression 07/19/2020   Ear infection    Recurrent; lots of scar tissue, surgeries on TM, eventually TM removed.   History of adenomatous polyp of colon 03/2016   Recall 5 yrs (Dr. AHavery Moros   History of pleurisy 1996   Hyperlipidemia    statin recommended 10/2017   Left breast mass 2015   Appears benign, no bx done, getting serial mammograms via [facility].  stable as of 08/2017.  Repeat imaging 6 mo--stable 02/2018.  Repeat 08/2018 showed changes so bx was done--BENIGN FIBROCYSTIC CHANGES!  Plan for rpt diag mammo/ultras 6 mo.   Positive skin test for tuberculosis age [age]approx   father had TB and died when  she was 89 pt doesn't recall having CXR or getting TB tx.   Rib fracture 12/2014   s/p fall   Tobacco dependence    UTI (urinary tract infection)    Past Surgical History:  Procedure Laterality Date   COLONOSCOPY  03/18/2016   Tubular adenoma x 1.  Recall 5 yrs   TUBAL LIGATION     tympanectomy     WISDOM TOOTH EXTRACTION      reports that she has been smoking e-cigarettes and cigarettes. She has a 20.00 pack-year smoking history. She has never used smokeless tobacco. She reports current alcohol use. She reports that she does not use drugs. family history includes Bladder Cancer in her mother; Breast cancer in her mother; COPD in her mother; Cancer in her maternal grandmother and mother; Emphysema in her father; Heart disease in her maternal grandmother and mother; Hypertension in her maternal grandmother and mother; Stroke in her maternal grandmother and mother; Tuberculosis in her father. No Known Allergies Current Outpatient Medications on File Prior to Visit  Medication Sig Dispense Refill   acetaminophen (TYLENOL) 500 MG tablet Take 500 mg by mouth every 6 (six) hours as needed.     cholecalciferol (VITAMIN D3) 25 MCG (1000 UNIT) tablet Take  5,000 Units by mouth daily.     [facility] VITAMIN B12 1000 MCG tablet TAKE 1 TABLET BY MOUTH EVERY DAY 90 tablet 3   polyethylene glycol (MIRALAX) 17 g packet Take 17 g by mouth daily. 14 each 0   No current facility-administered medications on file prior to visit.        ROS:  All others reviewed and negative.  Objective        PE:  BP 138/88 (BP Location: Right Arm, Patient Position: Sitting, Cuff Size: Normal)   Pulse 84   Temp 98 F (36.7 C) (Oral)   Ht '5\' 3"'$  (1.6 m)   Wt 159 lb (72.1 kg)   LMP 12/28/2013   BMI 28.17 kg/m                 Constitutional: Pt appears in NAD               HENT: Head: NCAT.                Right Ear: External ear normal.                 Left Ear: External ear normal.                Eyes: . Pupils are  equal, round, and reactive to light. Conjunctivae and EOM are normal               Nose: without d/c or deformity               Neck: Neck supple. Gross normal ROM               Cardiovascular: Normal rate and regular rhythm.                 Pulmonary/Chest: Effort normal and breath sounds without rales or wheezing.                Abd:  Soft, NT, ND, + BS, no organomegaly               Neurological: Pt is alert. At baseline orientation, motor grossly intact               Skin: Skin is warm. No rashes, no other new lesions, LE edema - none               Psychiatric: Pt behavior is normal without agitation   Micro: none  Cardiac tracings I have personally interpreted today:  none  Pertinent Radiological findings (summarize): none   Lab Results  Component Value Date   WBC 9.4 11/16/2020   HGB 15.1 (H) 11/16/2020   HCT 44.8 11/16/2020   PLT 272.0 11/16/2020   GLUCOSE 91 11/16/2020   CHOL 314 (H) 11/16/2020   TRIG 143.0 11/16/2020   HDL 57.80 11/16/2020   LDLCALC 227 (H) 11/16/2020   ALT 17 11/16/2020   AST 19 11/16/2020   NA 139 11/16/2020   K 4.5 11/16/2020   CL 103 11/16/2020   CREATININE 0.75 11/16/2020   BUN 8 11/16/2020   CO2 28 11/16/2020   TSH 0.67 11/16/2020   HGBA1C 5.5 11/16/2020   Assessment/Plan:  Christy [name] is a 56 y.o. White or Caucasian [1] female with  has a past medical history of Anxiety, Anxiety and depression (07/19/2020), Ear infection, History of adenomatous polyp of colon (03/2016), History of pleurisy (1996), Hyperlipidemia, Left breast mass (2015), Positive skin test for tuberculosis (age 25 approx), Rib fracture (12/2014),  Tobacco dependence, and UTI (urinary tract infection).  Vitamin D deficiency Last vitamin D Lab Results  Component Value Date   VD25OH 40.46 11/16/2020   Stable, cont oral replacement   Vitamin B12 deficiency Lab Results  Component Value Date   VITAMINB12 926 (H) 11/16/2020   Stable, cont oral replacement - b12 1000 mcg  qd   Hyperlipidemia Lab Results  Component Value Date   LDLCALC 227 (H) 11/16/2020   Severe uncontrolled - pt to continue current low chol diet at Card Ct score was 1.4   HTN (hypertension) BP Readings from Last 3 Encounters:  12/12/22 138/88  05/08/22 123/78  05/07/22 102/68   uncontrolled, pt to increase medical treatment losartan 100 mg qd   Barrett's esophagus Per recent EGD - cont PPI, f/u EGD at 2 yrs  History of colon polyps Also for colonoscopy as she is due  Encounter for well adult exam with abnormal findings Age and sex appropriate education and counseling updated with regular exercise and diet Referrals for preventative services - for colonoscopy Immunizations addressed - none needed Smoking counseling  - pt counsled to quit, pt not ready Evidence for depression or other mood disorder - none significant Most recent labs reviewed. I have personally reviewed and have noted: 1) the patient's medical and social history 2) The patient's current medications and supplements 3) The patient's height, weight, and BMI have been recorded in the chart   Hyperglycemia Lab Results  Component Value Date   HGBA1C 5.5 11/16/2020   Stable, pt to continue current medical treatment  - diet, wt control  Followup: Return in about 1 year (around 12/13/2023).  Cathlean Cower, MD 12/12/2022 9:05 PM [facility]

## 2022-12-12 NOTE — Assessment & Plan Note (Signed)
Also for colonoscopy as she is due

## 2022-12-12 NOTE — Patient Instructions (Addendum)
You will be contacted regarding the referral for: colonoscopy  Ok to increase the losartan to 100 mg per day  Please continue all other medications as before, and refills have been done if requested.  Please have the pharmacy call with any other refills you may need.  Please continue your efforts at being more active, low cholesterol diet, and weight control.  You are otherwise up to date with prevention measures today.  Please keep your appointments with your specialists as you may have planned  Please go to the LAB at the blood drawing area for the tests to be done  You will be contacted by phone if any changes need to be made immediately.  Otherwise, you will receive a letter about your results with an explanation, but please check with MyChart first.  Please remember to sign up for MyChart if you have not done so, as this will be important to you in the future with finding out test results, communicating by private email, and scheduling acute appointments online when needed.  Please make an Appointment to return for your 1 year visit, or sooner if needed

## 2022-12-12 NOTE — Assessment & Plan Note (Signed)
Per recent EGD - cont PPI, f/u EGD at 2 yrs

## 2022-12-12 NOTE — Assessment & Plan Note (Addendum)
BP Readings from Last 3 Encounters:  12/12/22 138/88  05/08/22 123/78  05/07/22 102/68   uncontrolled, pt to increase medical treatment losartan 100 mg qd

## 2022-12-12 NOTE — Assessment & Plan Note (Signed)
Lab Results  Component Value Date   HGBA1C 5.5 11/16/2020   Stable, pt to continue current medical treatment  - diet, wt control

## 2022-12-12 NOTE — Assessment & Plan Note (Signed)
Last vitamin D Lab Results  Component Value Date   VD25OH 40.46 11/16/2020   Stable, cont oral replacement

## 2022-12-12 NOTE — Assessment & Plan Note (Addendum)
Lab Results  Component Value Date   LDLCALC 227 (H) 11/16/2020   Severe uncontrolled - pt to continue current low chol diet at Card Ct score was 1.4

## 2022-12-12 NOTE — Assessment & Plan Note (Signed)
Age and sex appropriate education and counseling updated with regular exercise and diet Referrals for preventative services - for colonoscopy Immunizations addressed - none needed Smoking counseling  - pt counsled to quit, pt not ready Evidence for depression or other mood disorder - none significant Most recent labs reviewed. I have personally reviewed and have noted: 1) the patient's medical and social history 2) The patient's current medications and supplements 3) The patient's height, weight, and BMI have been recorded in the chart

## 2022-12-12 NOTE — Assessment & Plan Note (Signed)
Lab Results  Component Value Date   VITAMINB12 926 (H) 11/16/2020   Stable, cont oral replacement - b12 1000 mcg qd

## 2022-12-13 LAB — VITAMIN B12: Vitamin B-12: 1323 pg/mL — ABNORMAL HIGH (ref 211–911)

## 2022-12-13 LAB — VITAMIN D 25 HYDROXY (VIT D DEFICIENCY, FRACTURES): VITD: 67.17 ng/mL (ref 30.00–100.00)

## 2022-12-13 LAB — TSH: TSH: 0.89 u[IU]/mL (ref 0.35–5.50)

## 2022-12-17 ENCOUNTER — Other Ambulatory Visit: Payer: Self-pay | Admitting: Internal Medicine

## 2023-04-15 ENCOUNTER — Encounter: Payer: Self-pay | Admitting: Gastroenterology

## 2023-04-15 ENCOUNTER — Ambulatory Visit (AMBULATORY_SURGERY_CENTER): Payer: 59 | Admitting: *Deleted

## 2023-04-15 VITALS — Ht 63.0 in | Wt 157.0 lb

## 2023-04-15 DIAGNOSIS — Z86010 Personal history of colonic polyps: Secondary | ICD-10-CM

## 2023-04-15 MED ORDER — NA SULFATE-K SULFATE-MG SULF 17.5-3.13-1.6 GM/177ML PO SOLN: 1.0000 | Freq: Once | ORAL | 0 refills | Status: AC

## 2023-04-15 NOTE — Progress Notes (Signed)
Pt's name and DOB verified at the beginning of the pre-visit.  Pt denies any difficulty with ambulating,sitting, laying down or rolling side to side Gave both [facility] main # and MD on call # prior to instructions.  No egg or soy allergy known to patient  No issues known to pt with past sedation with any surgeries or procedures Pt denies having issues being intubated Patient denies ever being intubated Pt has no issues moving head neck or swallowing No FH of Malignant Hyperthermia Pt is not on diet pills Pt is not on home 02  Pt is not on blood thinners  Pt denies issues with constipation Pt uses miralax for issue. Instructed to continue Pt is not on dialysis Pt denies any upcoming cardiac testing Pt encouraged to use to use Singlecare or Goodrx to reduce cost  Patient's chart reviewed by Christy [name] CNRA prior to pre-visit and patient appropriate for the [facility].  Pre-visit completed and red dot placed by patient's name on their procedure day (on provider's schedule).  . Visit by phone Pt states weight is 157lb Instructed pt why it is important to and  to call if they have any changes in health or new medications. Directed them to the # given and on instructions.   Pt states they will.  Instructions reviewed with pt and pt states understanding. Instructed to review again prior to procedure. Pt states they will.  Instructions sent by mail with coupon and by my chart

## 2023-04-22 ENCOUNTER — Ambulatory Visit (AMBULATORY_SURGERY_CENTER): Payer: 59 | Admitting: Gastroenterology

## 2023-04-22 ENCOUNTER — Encounter: Payer: Self-pay | Admitting: Gastroenterology

## 2023-04-22 VITALS — BP 151/76 | HR 63 | Temp 98.0°F | Resp 14 | Ht 63.0 in | Wt 157.0 lb

## 2023-04-22 DIAGNOSIS — Z86010 Personal history of colonic polyps: Secondary | ICD-10-CM | POA: Diagnosis not present

## 2023-04-22 DIAGNOSIS — D123 Benign neoplasm of transverse colon: Secondary | ICD-10-CM

## 2023-04-22 DIAGNOSIS — K635 Polyp of colon: Secondary | ICD-10-CM | POA: Diagnosis not present

## 2023-04-22 DIAGNOSIS — Z09 Encounter for follow-up examination after completed treatment for conditions other than malignant neoplasm: Secondary | ICD-10-CM

## 2023-04-22 MED ORDER — SODIUM CHLORIDE 0.9 % IV SOLN
500.0000 mL | Freq: Once | INTRAVENOUS | Status: DC
Start: 2023-04-22 — End: 2023-04-22

## 2023-04-22 NOTE — Progress Notes (Signed)
To pacu, VSS. Report to [email or web address] 

## 2023-04-22 NOTE — Patient Instructions (Signed)
Handouts Provided:  Polyps  YOU HAD AN ENDOSCOPIC PROCEDURE TODAY AT THE [facility]:   Refer to the procedure report that was given to you for any specific questions about what was found during the examination.  If the procedure report does not answer your questions, please call your gastroenterologist to clarify.  If you requested that your care partner not be given the details of your procedure findings, then the procedure report has been included in a sealed envelope for you to review at your convenience later.  YOU SHOULD EXPECT: Some feelings of bloating in the abdomen. Passage of more gas than usual.  Walking can help get rid of the air that was put into your GI tract during the procedure and reduce the bloating. If you had a lower endoscopy (such as a colonoscopy or flexible sigmoidoscopy) you may notice spotting of blood in your stool or on the toilet paper. If you underwent a bowel prep for your procedure, you may not have a normal bowel movement for a few days.  Please Note:  You might notice some irritation and congestion in your nose or some drainage.  This is from the oxygen used during your procedure.  There is no need for concern and it should clear up in a day or so.  SYMPTOMS TO REPORT IMMEDIATELY:  Following lower endoscopy (colonoscopy or flexible sigmoidoscopy):  Excessive amounts of blood in the stool  Significant tenderness or worsening of abdominal pains  Swelling of the abdomen that is new, acute  Fever of 100F or higher  For urgent or emergent issues, a gastroenterologist can be reached at any hour by calling [phone]. Do not use MyChart messaging for urgent concerns.    DIET:  We do recommend a small meal at first, but then you may proceed to your regular diet.  Drink plenty of fluids but you should avoid alcoholic beverages for 24 hours.  ACTIVITY:  You should plan to take it easy for the rest of today and you should NOT DRIVE or use heavy  machinery until tomorrow (because of the sedation medicines used during the test).    FOLLOW UP: Our staff will call the number listed on your records the next business day following your procedure.  We will call around 7:15- 8:00 am to check on you and address any questions or concerns that you may have regarding the information given to you following your procedure. If we do not reach you, we will leave a message.     If any biopsies were taken you will be contacted by phone or by letter within the next 1-3 weeks.  Please call us at [phone] if you have not heard about the biopsies in 3 weeks.    SIGNATURES/CONFIDENTIALITY: You and/or your care partner have signed paperwork which will be entered into your electronic medical record.  These signatures attest to the fact that that the information above on your After Visit Summary has been reviewed and is understood.  Full responsibility of the confidentiality of this discharge information lies with you and/or your care-partner.  

## 2023-04-22 NOTE — Op Note (Signed)
[facility] Patient Name: Christy [name] Procedure Date: 04/22/2023 3:26 PM MRN: [id number] Endoscopist: Viviann Spare P. Adela Lank , MD, [id number] Age: 57 Referring MD:  Date of Birth: 1965-12-30 Gender: Female Account #: [id number] Procedure:                Colonoscopy Indications:              High risk colon cancer surveillance: Personal                            history of colonic polyps - history of adenoma                            removed 03/2016 Medicines:                Monitored Anesthesia Care Procedure:                Pre-Anesthesia Assessment:                           - Prior to the procedure, a History and Physical                            was performed, and patient medications and                            allergies were reviewed. The patient's tolerance of                            previous anesthesia was also reviewed. The risks                            and benefits of the procedure and the sedation                            options and risks were discussed with the patient.                            All questions were answered, and informed consent                            was obtained. Prior Anticoagulants: The patient has                            taken no anticoagulant or antiplatelet agents. ASA                            Grade Assessment: II - A patient with mild systemic                            disease. After reviewing the risks and benefits,                            the patient was deemed in satisfactory condition to  undergo the procedure.                           After obtaining informed consent, the colonoscope                            was passed under direct vision. Throughout the                            procedure, the patient's blood pressure, pulse, and                            oxygen saturations were monitored continuously. The                            [id number]-HQ190L Colonoscope [id number] was  introduced                            through the anus and advanced to the the cecum,                            identified by appendiceal orifice and ileocecal                            valve. The colonoscopy was performed without                            difficulty. The patient tolerated the procedure                            well. The quality of the bowel preparation was                            good. The ileocecal valve, appendiceal orifice, and                            rectum were photographed. Scope In: 3:38:04 PM Scope Out: 3:52:51 PM Scope Withdrawal Time: 0 hours 8 minutes 27 seconds  Total Procedure Duration: 0 hours 14 minutes 47 seconds  Findings:                 The perianal and digital rectal examinations were                            normal.                           A diminutive polyp was found in the transverse                            colon. The polyp was sessile. The polyp was removed                            with a cold snare. Resection and retrieval were  complete.                           A 3 mm polyp was found in the splenic flexure. The                            polyp was sessile. The polyp was removed with a                            cold snare. Resection and retrieval were complete.                           Internal hemorrhoids were found during                            retroflexion. The hemorrhoids were small.                           The exam was otherwise without abnormality. Complications:            No immediate complications. Estimated blood loss:                            Minimal. Estimated Blood Loss:     Estimated blood loss was minimal. Impression:               - One diminutive polyp in the transverse colon,                            removed with a cold snare. Resected and retrieved.                           - One 3 mm polyp at the splenic flexure, removed                            with a cold  snare. Resected and retrieved.                           - Internal hemorrhoids.                           - The examination was otherwise normal.                           - The GI Genius (intelligent endoscopy module),                            computer-aided polyp detection system powered by AI                            was utilized to detect colorectal polyps through                            enhanced visualization during colonoscopy. Recommendation:           - Patient has a contact number  available for                            emergencies. The signs and symptoms of potential                            delayed complications were discussed with the                            patient. Return to normal activities tomorrow.                            Written discharge instructions were provided to the                            patient.                           - Resume previous diet.                           - Continue present medications.                           - Await pathology results. Viviann Spare P. Ramisa Duman, MD 04/22/2023 3:56:47 PM This report has been signed electronically.

## 2023-04-22 NOTE — Progress Notes (Signed)
Pt's states no medical or surgical changes since previsit or office visit. 

## 2023-04-22 NOTE — Progress Notes (Signed)
[facility] History and Physical   Primary Care Physician:  Corwin Levins, MD   Reason for Procedure:   History of colon polyps  Plan:    colonoscopy     HPI: Christy [name] is a 57 y.o. female  here for colonoscopy surveillance - last exam 03/2016 - adenoma removed.    Patient denies any bowel symptoms at this time. No family history of colon cancer known. Otherwise feels well without any cardiopulmonary symptoms.   I have discussed risks / benefits of anesthesia and endoscopic procedure with Anastasio Auerbach and they wish to proceed with the exams as outlined today.    Past Medical History:  Diagnosis Date   Anxiety    Anxiety and depression 07/19/2020   Ear infection    Recurrent; lots of scar tissue, surgeries on TM, eventually TM removed.   History of adenomatous polyp of colon 03/2016   Recall 5 yrs (Dr. Adela Lank)   History of pleurisy 1996   Hyperlipidemia    statin recommended 10/2017   Hypertension    Left breast mass 2015   Appears benign, no bx done, getting serial mammograms via [facility].  stable as of 08/2017.  Repeat imaging 6 mo--stable 02/2018.  Repeat 08/2018 showed changes so bx was done--BENIGN FIBROCYSTIC CHANGES!  Plan for rpt diag mammo/ultras 6 mo.   Positive skin test for tuberculosis age 23 approx   father had TB and died when she was 49; pt doesn't recall having CXR or getting TB tx.   Rib fracture 12/2014   s/p fall   UTI (urinary tract infection)     Past Surgical History:  Procedure Laterality Date   COLONOSCOPY  03/18/2016   Tubular adenoma x 1.  Recall 5 yrs   ear drum removal Right    ear age 6   TUBAL LIGATION     tympanectomy     WISDOM TOOTH EXTRACTION      Prior to Admission medications   Medication Sig Start Date End Date Taking? Authorizing Provider  acetaminophen (TYLENOL) 500 MG tablet Take 500 mg by mouth every 6 (six) hours as needed.   Yes [provider]  cholecalciferol (VITAMIN D3) 25 MCG  (1000 UNIT) tablet Take 5,000 Units by mouth daily.   Yes [provider]  [facility] VITAMIN B12 1000 MCG tablet TAKE 1 TABLET BY MOUTH EVERY DAY 10/21/22  Yes Corwin Levins, MD  FLUoxetine (PROZAC) 40 MG capsule TAKE 1 CAPSULE BY MOUTH DAILY 12/18/22  Yes Corwin Levins, MD  losartan (COZAAR) 100 MG tablet Take 1 tablet (100 mg total) by mouth daily. 12/12/22  Yes Corwin Levins, MD  pantoprazole (PROTONIX) 40 MG tablet Take 1 tablet (40 mg total) by mouth daily. 12/12/22  Yes Corwin Levins, MD  polyethylene glycol (MIRALAX) 17 g packet Take 17 g by mouth daily. 05/07/22   Midas Daughety, Willaim Rayas, MD    Current Outpatient Medications  Medication Sig Dispense Refill   acetaminophen (TYLENOL) 500 MG tablet Take 500 mg by mouth every 6 (six) hours as needed.     cholecalciferol (VITAMIN D3) 25 MCG (1000 UNIT) tablet Take 5,000 Units by mouth daily.     [facility] VITAMIN B12 1000 MCG tablet TAKE 1 TABLET BY MOUTH EVERY DAY 90 tablet 3   FLUoxetine (PROZAC) 40 MG capsule TAKE 1 CAPSULE BY MOUTH DAILY 90 capsule 3   losartan (COZAAR) 100 MG tablet Take 1 tablet (100 mg total) by mouth daily. 90 tablet 3  pantoprazole (PROTONIX) 40 MG tablet Take 1 tablet (40 mg total) by mouth daily. 90 tablet 3   polyethylene glycol (MIRALAX) 17 g packet Take 17 g by mouth daily. 14 each 0   Current Facility-Administered Medications  Medication Dose Route Frequency Provider Last Rate Last Admin   0.9 %  sodium chloride infusion  500 mL Intravenous Once Lyrick Worland, Willaim Rayas, MD        Allergies as of 04/22/2023   (No Known Allergies)    Family History  Problem Relation Age of Onset   Breast cancer Mother    Cancer Mother        uterine, bladder   Hypertension Mother    Stroke Mother    Heart disease Mother    COPD Mother    Bladder Cancer Mother    Emphysema Father    Tuberculosis Father    Cancer Maternal Grandmother        uterine   Stroke Maternal Grandmother    Heart disease Maternal Grandmother     Hypertension Maternal Grandmother    Colon polyps Neg Hx    Colon cancer Neg Hx    Esophageal cancer Neg Hx    Rectal cancer Neg Hx    Stomach cancer Neg Hx     Social History   Socioeconomic History   Marital status: Married    Spouse name: Not on file   Number of children: Not on file   Years of education: Not on file   Highest education level: Not on file  Occupational History   Not on file  Tobacco Use   Smoking status: Every Day    Types: E-cigarettes, Cigarettes   Smokeless tobacco: Never   Tobacco comments:    e-vapor cigarettes -3 mg nicotine and does 3 0z a day   Vaping Use   Vaping Use: Every day  Substance and Sexual Activity   Alcohol use: Yes    Alcohol/week: 0.0 standard drinks of alcohol    Comment: socially   Drug use: No   Sexual activity: Yes    Birth control/protection: Post-menopausal    Comment: first intercourse- 14, 5 partners  Other Topics Concern   Not on file  Social History Narrative   Married, 3 children.   Housewife.   Orig from [place], relocated to [facility] approx 2007.   +Smoker.   Social Determinants of Health   Financial Resource Strain: Not on file  Food Insecurity: Not on file  Transportation Needs: Not on file  Physical Activity: Not on file  Stress: Not on file  Social Connections: Not on file  Intimate Partner Violence: Not on file    Review of Systems: All other review of systems negative except as mentioned in the HPI.  Physical Exam: Vital signs BP (!) 152/88   Pulse 81   Temp 98 F (36.7 C)   Ht 5\' 3"  (1.6 m)   Wt 157 lb (71.2 kg)   LMP 12/28/2013   SpO2 96%   BMI 27.81 kg/m   General:   Alert,  Well-developed, pleasant and cooperative in NAD Lungs:  Clear throughout to auscultation.   Heart:  Regular rate and rhythm Abdomen:  Soft, nontender and nondistended.   Neuro/Psych:  Alert and cooperative. Normal mood and affect. A and O x 3  Harlin Rain, MD [facility]

## 2023-04-22 NOTE — Progress Notes (Signed)
Called to room to assist during endoscopic procedure.  Patient ID and intended procedure confirmed with present staff. Received instructions for my participation in the procedure from the performing physician.  

## 2023-04-23 ENCOUNTER — Telehealth: Payer: Self-pay

## 2023-04-23 NOTE — Telephone Encounter (Signed)
  Follow up Call-     04/22/2023    3:13 PM 05/08/2022    9:38 AM  Call back number  Post procedure Call Back phone  # [phone]  Permission to leave phone message Yes Yes     Patient questions:  Do you have a fever, pain , or abdominal swelling? No. Pain Score  0 *  Have you tolerated food without any problems? Yes.    Have you been able to return to your normal activities? Yes.    Do you have any questions about your discharge instructions: Diet   No. Medications  No. Follow up visit  No.  Do you have questions or concerns about your Care? No.  Actions: * If pain score is 4 or above: No action needed, pain <4.

## 2023-12-15 ENCOUNTER — Encounter: Payer: 59 | Admitting: Internal Medicine

## 2023-12-27 IMAGING — CT CT CARDIAC CORONARY ARTERY CALCIUM SCORE
3 series · 14 of 20 positions shown, 16 images · non-contrast
Comparison: None.
COMPARISON: None.

Addendum:
EXAM:
OVER-READ INTERPRETATION  CT CHEST

The following report is an over-read performed by radiologist Dr.
Idelver Plus [REDACTED] on 01/03/2022. This
over-read does not include interpretation of cardiac or coronary
anatomy or pathology. The coronary calcium score interpretation by
the cardiologist is attached.
CLINICAL DATA: Cardiovascular Disease Risk stratification
Coronary Calcium Score
TECHNIQUE: A gated, non-contrast computed tomography scan of the heart was
performed using 3mm slice thickness. Axial images were analyzed on a
dedicated workstation. Calcium scoring of the coronary arteries was
performed using the Agatston method.

[Series 3: cascseq 2.0 sa36 (id) (id) · axial · 0.39mm/px · z∈[-212,-132]mm · 4 of 68 slices shown]
[im 14/68  vessel]
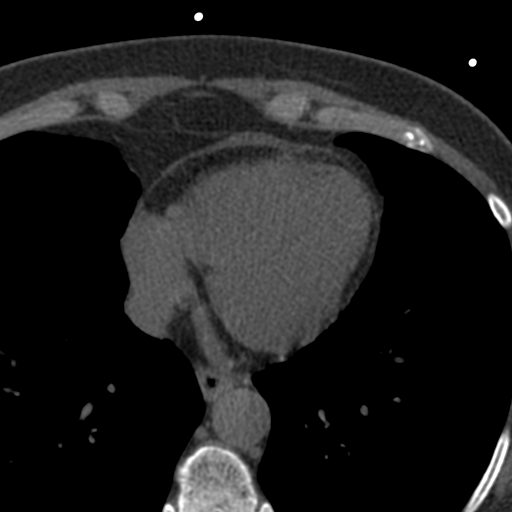
[im 27/68  vessel]
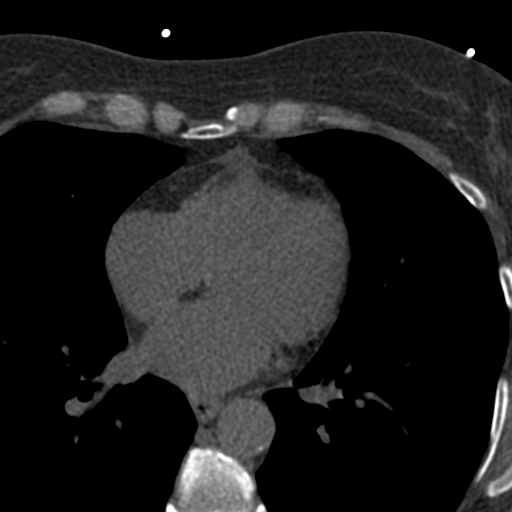
[im 41/68  vessel]
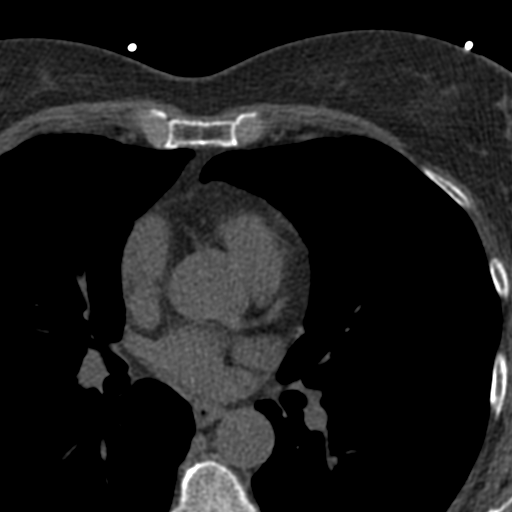
[im 54/68  vessel]
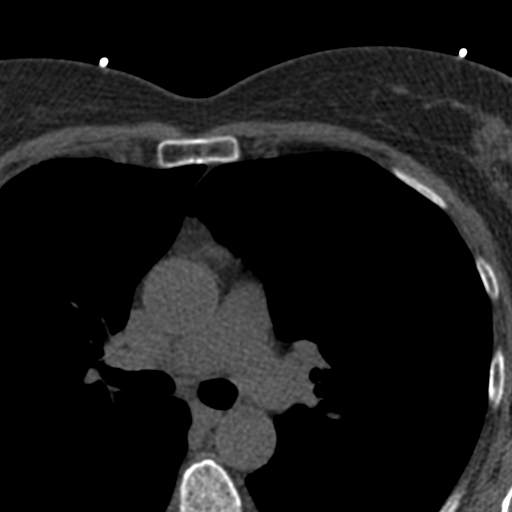

[Series 4: cascseq 2.0 bf37 st · axial · 0.69mm/px · z∈[-216,-128]mm · 5 of 68 slices shown, 7 images]
[im 12/68  vessel]
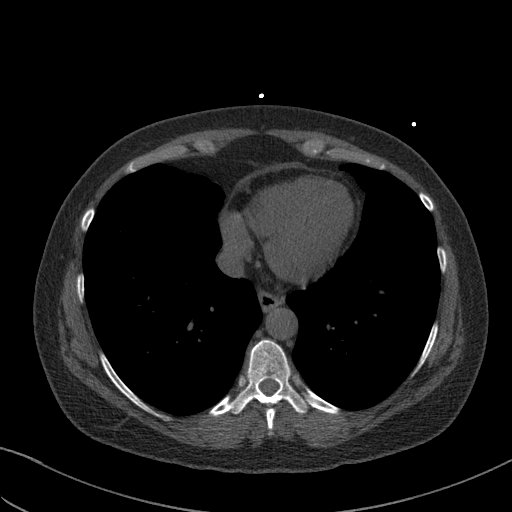
[im 12/68  lung]
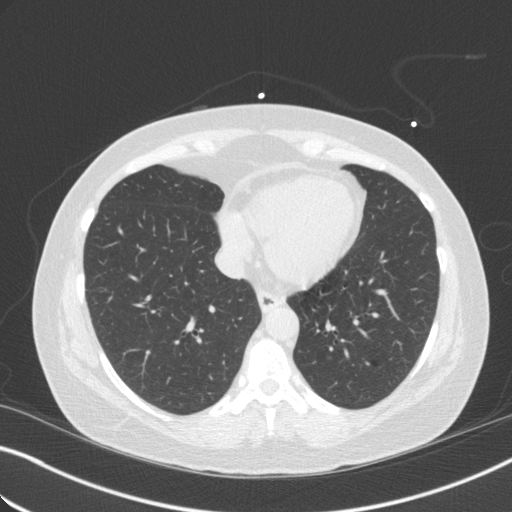
[im 23/68  vessel]
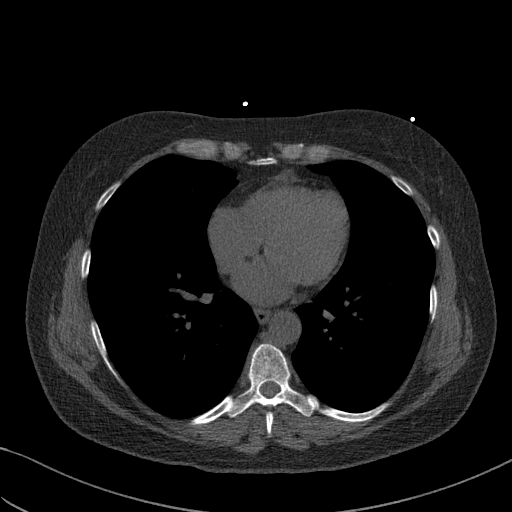
[im 34/68  vessel]
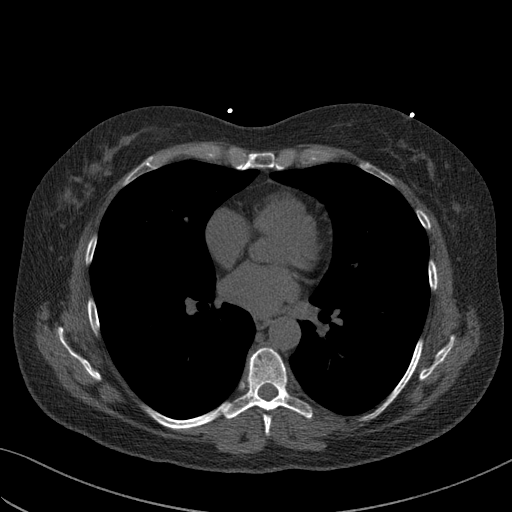
[im 45/68  vessel]
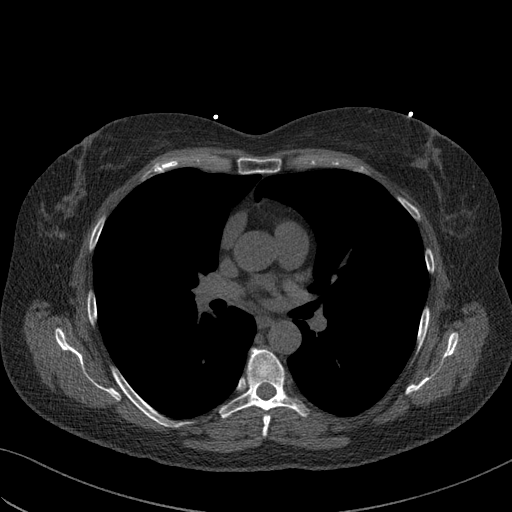
[im 56/68  vessel]
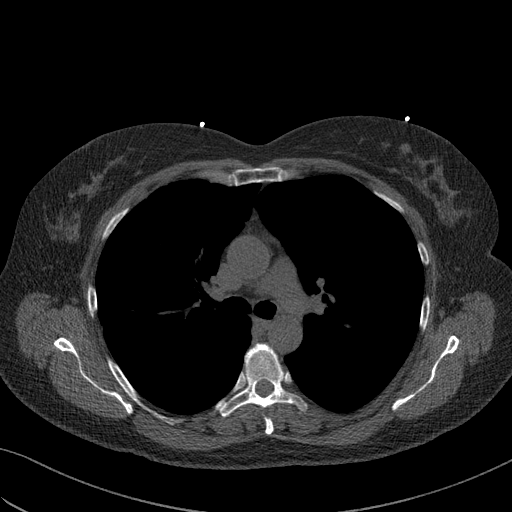
[im 56/68  lung]
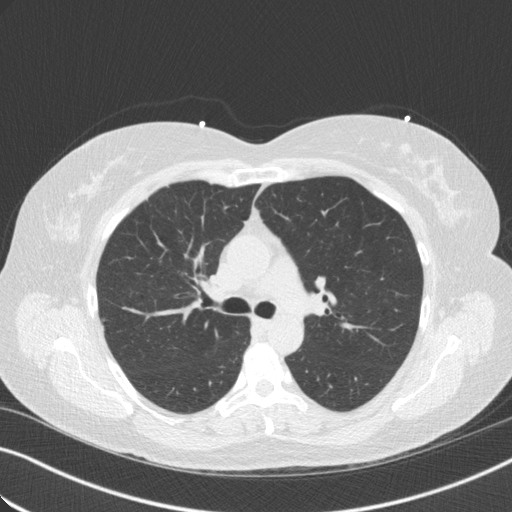

[Series 5: cascseq 2.0 br59 lung · axial · 0.69mm/px · z∈[-216,-128]mm · 5 of 68 slices shown]
[im 12/68  lung]
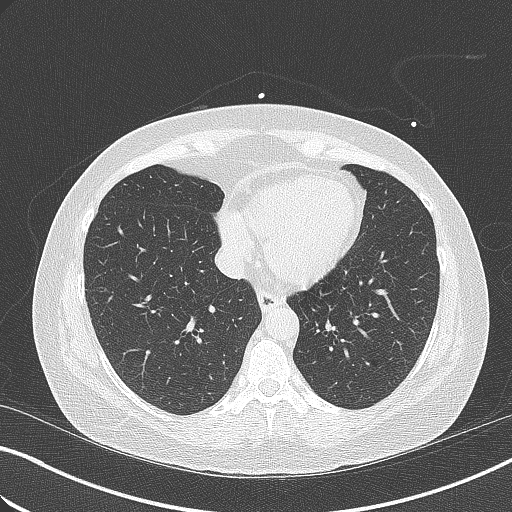
[im 23/68  lung]
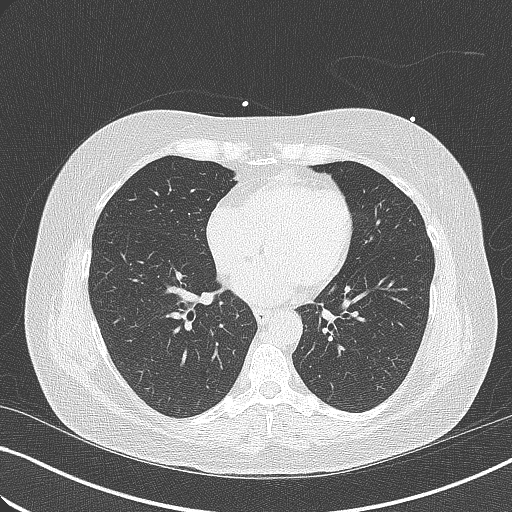
[im 34/68  lung]
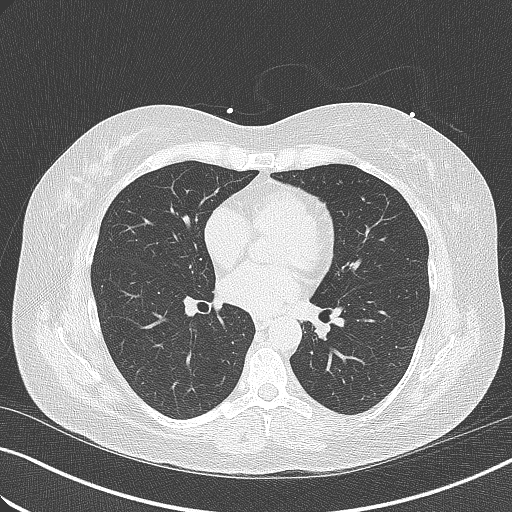
[im 45/68  lung]
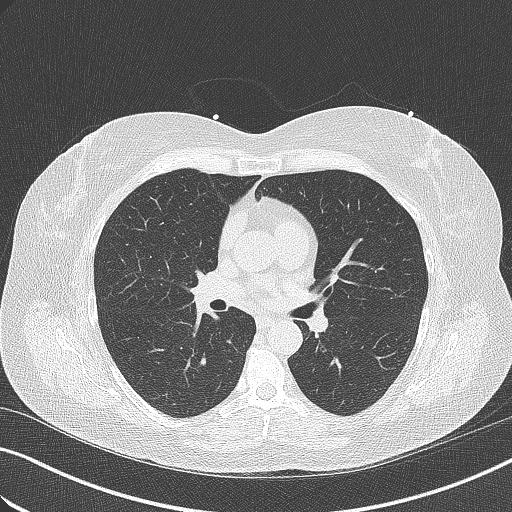
[im 56/68  lung]
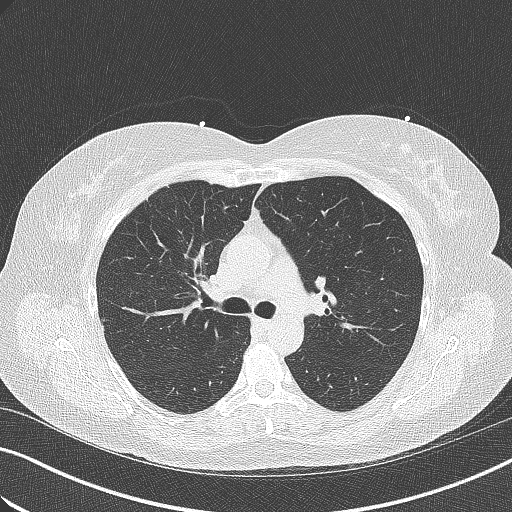

[14 of 20 positions shown; findings below may reference images not displayed]

FINDINGS: Vascular: No significant noncardiac vascular findings.

Mediastinum/Nodes: Calcified subcarinal lymph node is consistent
with prior granulomatous disease.

Lungs/Pleura: Calcified right middle lobe granulomata are consistent
with prior granulomatous disease. Visualized lungs show no evidence
of pulmonary edema, consolidation, pneumothorax or pleural fluid.

Upper Abdomen: No acute abnormality.

Musculoskeletal: No chest wall mass or suspicious bone lesions
identified.
IMPRESSION: Evidence of prior granulomatous disease with calcified granulomata
in the right middle lobe and associated calcified subcarinal lymph
node.
FINDINGS: Coronary arteries: Normal origins.

Coronary Calcium Score:

Left main:

Left anterior descending artery: 0

Left circumflex artery: 0

Right coronary artery:

Total:

Percentile: 73rd

Pericardium: Appears thickened, most prominently adjacent to the
right ventricle and at the apex. Image saved to PACS.

Aorta: Normal caliber of ascending aorta. Aortic atherosclerosis
noted.

Non-cardiac: See separate report from [REDACTED].
IMPRESSION: Coronary calcium score of 1.4. This was 73rd percentile for age-,
race-, and sex-matched controls.

Aortic atherosclerosis

Thickened pericardium



If CAC=0, it is reasonable to withhold statin therapy and reassess
in 5 to 10 years, as long as higher risk conditions are absent
(diabetes mellitus, family history of premature CHD in first degree
relatives (males <55 years; females <65 years), cigarette smoking,
or LDL >=190 mg/dL).

If CAC is 1 to 99, it is reasonable to initiate statin therapy for
patients >=55 years of age.

If CAC is >=100 or >=75th percentile, it is reasonable to initiate
statin therapy at any age.

Cardiology referral should be considered for patients with CAC
scores >=400 or >=75th percentile.

*9996 AHA/ACC/AACVPR/AAPA/ABC/AMNON/TIITE/RTOYOTA/Et Joshua/EUSTACHE/PLUSVITAL MALTA/NNANNA
Guideline on the Management of Blood Cholesterol: A Report of the
American College of Cardiology/American Heart Association Task Force
on Clinical Practice Guidelines. J Am Coll Cardiol.
7389;73(24):9892-9753.

*** End of Addendum ***
EXAM:
OVER-READ INTERPRETATION  CT CHEST

The following report is an over-read performed by radiologist Dr.
Idelver Plus [REDACTED] on 01/03/2022. This
over-read does not include interpretation of cardiac or coronary
anatomy or pathology. The coronary calcium score interpretation by
the cardiologist is attached.
FINDINGS: Vascular: No significant noncardiac vascular findings.

Mediastinum/Nodes: Calcified subcarinal lymph node is consistent
with prior granulomatous disease.

Lungs/Pleura: Calcified right middle lobe granulomata are consistent
with prior granulomatous disease. Visualized lungs show no evidence
of pulmonary edema, consolidation, pneumothorax or pleural fluid.

Upper Abdomen: No acute abnormality.

Musculoskeletal: No chest wall mass or suspicious bone lesions
identified.
IMPRESSION: Evidence of prior granulomatous disease with calcified granulomata
in the right middle lobe and associated calcified subcarinal lymph
node.

## 2024-10-22 ENCOUNTER — Ambulatory Visit: Admitting: Family Medicine

## 2024-10-22 ENCOUNTER — Ambulatory Visit: Payer: Self-pay

## 2024-10-22 NOTE — Telephone Encounter (Signed)
 FYI Only or Action Required?: FYI only for provider: appointment scheduled on 10/22/2024.  Patient was last seen in primary care on 12/12/2022 by Norleen Lynwood ORN, MD.  Called Nurse Triage reporting Hemorrhoids.  Symptoms began several days ago.  Interventions attempted: OTC medications: Miralax  and topical cream.  Symptoms are: gradually worsening.  Triage Disposition: See HCP Within 4 Hours (Or PCP Triage)  Patient/caregiver understands and will follow disposition?: Yes           Message from [facility] T sent at 10/22/2024 10:09 AM EST  Reason for Triage: back pain level 7, hemmorhoids bleeding when wiping- Please [phone]   Reason for Disposition  SEVERE rectal pain (e.g., excruciating, unable to have a bowel movement)  Answer Assessment - Initial Assessment Questions 1. STOOL PATTERN OR FREQUENCY: How often do you have a bowel movement (BM)?  (Normal range: 3 times a day to every 3 days)  When was your last BM?       Last BM 2 hours ago, 5 this morning so far  2. STRAINING: Do you have to strain to have a BM?      Yes  3. ONSET: When did the constipation begin?     2 days ago  4. RECTAL PAIN: Does your rectum hurt when the stool comes out? If Yes, ask: Do you have hemorrhoids? How bad is the pain?  (Scale 1-10; or mild, moderate, severe)     Severe 5. BM COMPOSITION: Are the stools hard?      Yes  6. BLOOD ON STOOLS: Has there been any blood on the toilet tissue or on the surface of the BM? If Yes, ask: When was the last time?     Yes  7. CHRONIC CONSTIPATION: Is this a new problem for you?  If No, ask: How long have you had this problem? (days, weeks, months)      Yes  8. LAXATIVES: Have you been using any stool softeners, laxatives, or enemas?  If Yes, ask What are you using, how often, and when was the last time?       Miralax  9. OTHER SYMPTOMS: Do you have any other symptoms? (e.g., abdomen pain, bloating, fever, vomiting)     Lower  Back pain that is a 7/10 Took miralax  and using topical cream for symptoms.  Protocols used: Rectal Symptoms-A-AH

## 2025-03-02 ENCOUNTER — Encounter: Admitting: Internal Medicine
